# Patient Record
Sex: Female | Born: 1975 | Race: White | Hispanic: No | Marital: Married | Smoking: Never smoker
Health system: Southern US, Community
[De-identification: ages and names within clinical notes are randomized; demographics above are authoritative.]

## PROBLEM LIST (undated history)

## (undated) DIAGNOSIS — L409 Psoriasis, unspecified: Secondary | ICD-10-CM

## (undated) DIAGNOSIS — F419 Anxiety disorder, unspecified: Secondary | ICD-10-CM

## (undated) DIAGNOSIS — I10 Essential (primary) hypertension: Secondary | ICD-10-CM

## (undated) DIAGNOSIS — G2581 Restless legs syndrome: Secondary | ICD-10-CM

## (undated) HISTORY — PX: BREAST LUMPECTOMY: SHX2

## (undated) HISTORY — PX: VAGINAL HYSTERECTOMY: SUR661

---

## 2000-01-16 HISTORY — PX: BREAST LUMPECTOMY: SHX2

## 2001-01-15 DIAGNOSIS — T8859XA Other complications of anesthesia, initial encounter: Secondary | ICD-10-CM

## 2001-01-15 HISTORY — DX: Other complications of anesthesia, initial encounter: T88.59XA

## 2015-11-05 ENCOUNTER — Emergency Department
Admission: EM | Admit: 2015-11-05 | Discharge: 2015-11-05 | Disposition: A | Discharge status: Home / Self Care | Payer: BLUE CROSS/BLUE SHIELD | Attending: Emergency Medicine | Admitting: Emergency Medicine

## 2015-11-05 ENCOUNTER — Encounter: Payer: Self-pay | Admitting: Emergency Medicine

## 2015-11-05 ENCOUNTER — Emergency Department: Payer: BLUE CROSS/BLUE SHIELD

## 2015-11-05 DIAGNOSIS — I10 Essential (primary) hypertension: Secondary | ICD-10-CM | POA: Diagnosis not present

## 2015-11-05 DIAGNOSIS — L03032 Cellulitis of left toe: Secondary | ICD-10-CM | POA: Insufficient documentation

## 2015-11-05 DIAGNOSIS — M79675 Pain in left toe(s): Secondary | ICD-10-CM | POA: Diagnosis present

## 2015-11-05 HISTORY — DX: Psoriasis, unspecified: L40.9

## 2015-11-05 HISTORY — DX: Essential (primary) hypertension: I10

## 2015-11-05 LAB — BASIC METABOLIC PANEL
Anion gap: 7 (ref 5–15)
BUN: 16 mg/dL (ref 6–20)
CALCIUM: 8.9 mg/dL (ref 8.9–10.3)
CHLORIDE: 102 mmol/L (ref 101–111)
CO2: 26 mmol/L (ref 22–32)
CREATININE: 0.89 mg/dL (ref 0.44–1.00)
GFR calc Af Amer: >60 mL/min (ref >60)
GFR calc non Af Amer: >60 mL/min (ref >60)
GLUCOSE: 84 mg/dL (ref 65–99)
Potassium: 3.7 mmol/L (ref 3.5–5.1)
Sodium: 135 mmol/L (ref 135–145)

## 2015-11-05 LAB — CBC WITH DIFFERENTIAL/PLATELET
BASOS PCT: 1 %
Basophils Absolute: 0.1 10*3/uL (ref 0–0.1)
EOS ABS: 0.1 10*3/uL (ref 0–0.7)
Eosinophils Relative: 1 %
HEMATOCRIT: 35.1 % (ref 35.0–47.0)
HEMOGLOBIN: 12 g/dL (ref 12.0–16.0)
LYMPHS ABS: 1.4 10*3/uL (ref 1.0–3.6)
Lymphocytes Relative: 16 %
MCH: 32.3 pg (ref 26.0–34.0)
MCHC: 34.2 g/dL (ref 32.0–36.0)
MCV: 94.5 fL (ref 80.0–100.0)
MONO ABS: 0.4 10*3/uL (ref 0.2–0.9)
MONOS PCT: 4 %
NEUTROS ABS: 7 10*3/uL — AB (ref 1.4–6.5)
NEUTROS PCT: 78 %
Platelets: 264 10*3/uL (ref 150–440)
RBC: 3.72 MIL/uL — ABNORMAL LOW (ref 3.80–5.20)
RDW: 12.7 % (ref 11.5–14.5)
WBC: 9 10*3/uL (ref 3.6–11.0)

## 2015-11-05 MED ORDER — DIPHENHYDRAMINE HCL 50 MG/ML IJ SOLN
INTRAMUSCULAR | Status: AC
Start: 2015-11-05 — End: 2015-11-05
  Administered 2015-11-05: 25 mg via INTRAVENOUS
  Filled 2015-11-05: qty 1

## 2015-11-05 MED ORDER — DEXAMETHASONE SODIUM PHOSPHATE 10 MG/ML IJ SOLN
10.0000 mg | Freq: Once | INTRAMUSCULAR | Status: DC
  Filled 2015-11-05: qty 1

## 2015-11-05 MED ORDER — CIPROFLOXACIN IN D5W 400 MG/200ML IV SOLN
400.0000 mg | Freq: Once | INTRAVENOUS | Status: AC
  Administered 2015-11-05: 400 mg via INTRAVENOUS
  Filled 2015-11-05: qty 200

## 2015-11-05 MED ORDER — DIPHENHYDRAMINE HCL 50 MG/ML IJ SOLN
25.0000 mg | Freq: Once | INTRAMUSCULAR | Status: AC
  Administered 2015-11-05: 25 mg via INTRAVENOUS

## 2015-11-05 MED ORDER — CIPROFLOXACIN HCL 500 MG PO TABS: 500.0000 mg | ORAL_TABLET | Freq: Two times a day (BID) | ORAL | 0 refills | Status: DC

## 2015-11-05 MED ORDER — HYDROCODONE-ACETAMINOPHEN 5-325 MG PO TABS: 1.0000 | ORAL_TABLET | Freq: Four times a day (QID) | ORAL | 0 refills | Status: DC | PRN

## 2015-11-05 MED ORDER — CLINDAMYCIN HCL 300 MG PO CAPS: 300.0000 mg | ORAL_CAPSULE | Freq: Three times a day (TID) | ORAL | 0 refills | Status: AC

## 2015-11-05 MED ORDER — DEXAMETHASONE SODIUM PHOSPHATE 10 MG/ML IJ SOLN
10.0000 mg | Freq: Once | INTRAMUSCULAR | Status: AC
  Administered 2015-11-05: 10 mg via INTRAVENOUS

## 2015-11-05 NOTE — ED Provider Notes (Signed)
[facility] Emergency Department Provider Note ____________________________________________  Time seen: 1146  I have reviewed the triage vital signs and the nursing notes.  HISTORY  Chief Complaint  Toe Pain  HPI Suzanne Stout is a 40 y.o. female presents to the ED for evaluation of pain, swelling, and redness to the left foot. She noted, what she thought was a flare of her psoriasis about 3 days prior to the dorsal aspect of the 4th & 5th toes. She admits to scratching the toes, excessively overnight, 2 days prior. Since Thursday, she has noted increased pain, swelling, and difficulty with bearing weight through the left foot. She is also aware some tenderness and swelling to the dorsal aspect of the foot. She took The last pain and denies any other injury. She is aware of a small ulcerated wound between the fourth and fifth toes that she noted this morning.She denies any acute fevers, chills, or sweats. She also denies any overall feeling of malaise or infirmary.  Past Medical History:  Diagnosis Date  . Hypertension   . Psoriasis    There are no active problems to display for this patient.  Past Surgical History:  Procedure Laterality Date  . BREAST LUMPECTOMY      Prior to Admission medications   Medication Sig Start Date End Date Taking? Authorizing Provider  ciprofloxacin (CIPRO) 500 MG tablet Take 1 tablet (500 mg total) by mouth 2 (two) times daily. 11/05/15   Marchell Froman V Bacon Mansfield Dann, PA-C  clindamycin (CLEOCIN) 300 MG capsule Take 1 capsule (300 mg total) by mouth 3 (three) times daily. 11/05/15 11/15/15  Aalaiyah Yassin V Bacon Ronen Bromwell, PA-C   Allergies Latex  No family history on file.  Social History Social History  Substance Use Topics  . Smoking status: Never Smoker  . Smokeless tobacco: Not on file  . Alcohol use No   Review of Systems  Constitutional: Negative for fever. Cardiovascular: Negative for chest pain. Respiratory: Negative for  shortness of breath. Musculoskeletal: Negative for back pain. Left foot pain and swelling as above. Skin: Negative for rash. Neurological: Negative for headaches, focal weakness or numbness. ____________________________________________  PHYSICAL EXAM:  VITAL SIGNS: ED Triage Vitals  Enc Vitals Group     BP 11/05/15 1054 (!) 101/59     Pulse Rate 11/05/15 1054 88     Resp 11/05/15 1054 18     Temp 11/05/15 1054 98.1 F (36.7 C)     Temp Source 11/05/15 1054 Oral     SpO2 11/05/15 1054 100 %     Weight 11/05/15 1056 150 lb (68 kg)     Height 11/05/15 1056 5\' 4"  (1.626 m)     Head Circumference --      Peak Flow --      Pain Score 11/05/15 1056 3     Pain Loc --      Pain Edu? --      Excl. in [place]? --    Constitutional: Alert and oriented. Well appearing and in no distress. Head: Normocephalic and atraumatic. Cardiovascular: Normal rate, regular rhythm. Normal distal pulses. Respiratory: Normal respiratory effort. No wheezes/rales/rhonchi. Musculoskeletal: Nontender with normal range of motion in all extremities.  Neurologic:  Normal gait without ataxia. Normal speech and language. No gross focal neurologic deficits are appreciated. Skin:  Skin is warm, dry and intact. No rash noted. Left foot with obvious dorsal swelling and erythema arising from the fourth webspace. There is some relief streaking up the dorsal aspect of the foot.  The fourth webspace is notable for a open wound with some macerated tissue and mild purulent drainage. Psychiatric: Mood and affect are normal. Patient exhibits appropriate insight and judgment. ____________________________________________   LABS (pertinent positives/negatives) Labs Reviewed  CBC WITH DIFFERENTIAL/PLATELET - Abnormal; Notable for the following:       Result Value   RBC 3.72 (*)    Neutro Abs 7.0 (*)    All other components within normal limits  AEROBIC/ANAEROBIC CULTURE (SURGICAL/DEEP WOUND)  BASIC METABOLIC PANEL   ____________________________________________   RADIOLOGY  Left Foot  IMPRESSION: Negative.  Small plantar spur of calcaneus. I, Lisett Dirusso, Dannielle Karvonen, personally viewed and evaluated these images (plain radiographs) as part of my medical decision making, as well as reviewing the written report by the radiologist. ____________________________________________  PROCEDURES  Cipro 400 mg IVP Decadron 10 mg IVP ____________________________________________  INITIAL IMPRESSION / ASSESSMENT AND PLAN / ED COURSE  Patient with an acute foot cellulitis secondary to an open wound to the fourth webspace. She treated empirically with IV Cipro for Pseudomonas coverage. A wound culture is pending at the time of discharge. She'll be also discharged with PO Cipro as well as clindamycin for MSSA/MRSA coverage.   Clinical Course   ____________________________________________  FINAL CLINICAL IMPRESSION(S) / ED DIAGNOSES  Final diagnoses:  Cellulitis of toe of left foot      Melvenia Needles, PA-C 11/05/15 1459    Orbie Pyo, MD 11/05/15 [phone]

## 2015-11-05 NOTE — ED Triage Notes (Signed)
Reddened 4th toe L foot. States was rubbing it yesterday and then noted it reddened.

## 2015-11-05 NOTE — Discharge Instructions (Signed)
Take the prescription meds as directed. Rest with the foot elevated when seated. Avoid walking around barefoot. Keep the wound clean and dry. Follow-up with your primary provider for wound check or return to the ED for signs of worsening infection.

## 2015-11-05 NOTE — ED Notes (Addendum)
Pt states she felt like her psoriasis was flaring up in left foot on the 4th and 5th toes. Pt states she has been scratching at her toes and is having a lot of pain since Thursday.  Pt states it is difficult to walk on. Pt has hx of injury to toes in March but declined any XR.  Pt states she took Advil last night for pain, but nothing this morning.  Redness and tenderness around toes 3-5.

## 2015-11-12 LAB — AEROBIC/ANAEROBIC CULTURE (SURGICAL/DEEP WOUND): SPECIAL REQUESTS: NORMAL

## 2015-11-12 LAB — AEROBIC/ANAEROBIC CULTURE W GRAM STAIN (SURGICAL/DEEP WOUND)

## 2016-01-17 ENCOUNTER — Inpatient Hospital Stay: Payer: BLUE CROSS/BLUE SHIELD

## 2016-01-17 ENCOUNTER — Encounter: Payer: Self-pay | Admitting: Emergency Medicine

## 2016-01-17 ENCOUNTER — Emergency Department: Payer: BLUE CROSS/BLUE SHIELD

## 2016-01-17 ENCOUNTER — Inpatient Hospital Stay
Admission: EM | Admit: 2016-01-17 | Discharge: 2016-01-19 | DRG: 384 | Disposition: A | Discharge status: Home / Self Care | Payer: BLUE CROSS/BLUE SHIELD | Attending: Internal Medicine | Admitting: Internal Medicine

## 2016-01-17 DIAGNOSIS — R933 Abnormal findings on diagnostic imaging of other parts of digestive tract: Secondary | ICD-10-CM | POA: Diagnosis not present

## 2016-01-17 DIAGNOSIS — K269 Duodenal ulcer, unspecified as acute or chronic, without hemorrhage or perforation: Secondary | ICD-10-CM | POA: Diagnosis present

## 2016-01-17 DIAGNOSIS — Z79899 Other long term (current) drug therapy: Secondary | ICD-10-CM

## 2016-01-17 DIAGNOSIS — Z91040 Latex allergy status: Secondary | ICD-10-CM

## 2016-01-17 DIAGNOSIS — K2950 Unspecified chronic gastritis without bleeding: Secondary | ICD-10-CM | POA: Diagnosis present

## 2016-01-17 DIAGNOSIS — Z0189 Encounter for other specified special examinations: Secondary | ICD-10-CM

## 2016-01-17 DIAGNOSIS — G2581 Restless legs syndrome: Secondary | ICD-10-CM | POA: Diagnosis present

## 2016-01-17 DIAGNOSIS — R1084 Generalized abdominal pain: Secondary | ICD-10-CM | POA: Diagnosis not present

## 2016-01-17 DIAGNOSIS — I10 Essential (primary) hypertension: Secondary | ICD-10-CM | POA: Diagnosis present

## 2016-01-17 DIAGNOSIS — K3189 Other diseases of stomach and duodenum: Secondary | ICD-10-CM | POA: Diagnosis present

## 2016-01-17 DIAGNOSIS — R109 Unspecified abdominal pain: Secondary | ICD-10-CM | POA: Diagnosis present

## 2016-01-17 HISTORY — DX: Restless legs syndrome: G25.81

## 2016-01-17 LAB — URINALYSIS, COMPLETE (UACMP) WITH MICROSCOPIC
Bacteria, UA: NONE SEEN
Bilirubin Urine: NEGATIVE
GLUCOSE, UA: NEGATIVE mg/dL
HGB URINE DIPSTICK: NEGATIVE
Ketones, ur: NEGATIVE mg/dL
Leukocytes, UA: NEGATIVE
Nitrite: NEGATIVE
Protein, ur: NEGATIVE mg/dL
RBC / HPF: NONE SEEN RBC/hpf (ref 0–5)
SPECIFIC GRAVITY, URINE: 1.014 (ref 1.005–1.030)
WBC UA: NONE SEEN WBC/hpf (ref 0–5)
pH: 7 (ref 5.0–8.0)

## 2016-01-17 LAB — CBC
HCT: 40.7 % (ref 35.0–47.0)
Hemoglobin: 14 g/dL (ref 12.0–16.0)
MCH: 31.8 pg (ref 26.0–34.0)
MCHC: 34.3 g/dL (ref 32.0–36.0)
MCV: 92.8 fL (ref 80.0–100.0)
PLATELETS: 451 10*3/uL — AB (ref 150–440)
RBC: 4.39 MIL/uL (ref 3.80–5.20)
RDW: 12.6 % (ref 11.5–14.5)
WBC: 19.3 10*3/uL — ABNORMAL HIGH (ref 3.6–11.0)

## 2016-01-17 LAB — COMPREHENSIVE METABOLIC PANEL
ALBUMIN: 4.2 g/dL (ref 3.5–5.0)
ALK PHOS: 72 U/L (ref 38–126)
ALT: 20 U/L (ref 14–54)
AST: 20 U/L (ref 15–41)
Anion gap: 10 (ref 5–15)
BUN: 15 mg/dL (ref 6–20)
CALCIUM: 9.9 mg/dL (ref 8.9–10.3)
CO2: 29 mmol/L (ref 22–32)
CREATININE: 0.95 mg/dL (ref 0.44–1.00)
Chloride: 96 mmol/L — ABNORMAL LOW (ref 101–111)
GFR calc Af Amer: >60 mL/min (ref >60)
GFR calc non Af Amer: >60 mL/min (ref >60)
GLUCOSE: 107 mg/dL — AB (ref 65–99)
Potassium: 3.8 mmol/L (ref 3.5–5.1)
SODIUM: 135 mmol/L (ref 135–145)
Total Bilirubin: 0.6 mg/dL (ref 0.3–1.2)
Total Protein: 8.3 g/dL — ABNORMAL HIGH (ref 6.5–8.1)

## 2016-01-17 LAB — LIPASE, BLOOD: Lipase: 25 U/L (ref 11–51)

## 2016-01-17 LAB — TROPONIN I

## 2016-01-17 MED ORDER — SODIUM CHLORIDE 0.9 % IV BOLUS (SEPSIS)
1000.0000 mL | Freq: Once | INTRAVENOUS | Status: AC
  Administered 2016-01-17: 1000 mL via INTRAVENOUS

## 2016-01-17 MED ORDER — ONDANSETRON HCL 4 MG PO TABS
4.0000 mg | ORAL_TABLET | Freq: Four times a day (QID) | ORAL | Status: DC | PRN
Start: 2016-01-17 — End: 2016-01-19

## 2016-01-17 MED ORDER — ONDANSETRON HCL 4 MG/2ML IJ SOLN
4.0000 mg | Freq: Four times a day (QID) | INTRAMUSCULAR | Status: DC | PRN
  Administered 2016-01-17: 4 mg via INTRAVENOUS
  Filled 2016-01-17: qty 2

## 2016-01-17 MED ORDER — FAMOTIDINE IN NACL 20-0.9 MG/50ML-% IV SOLN
20.0000 mg | Freq: Once | INTRAVENOUS | Status: AC
  Administered 2016-01-17: 20 mg via INTRAVENOUS
  Filled 2016-01-17: qty 50

## 2016-01-17 MED ORDER — IOPAMIDOL (ISOVUE-300) INJECTION 61%
30.0000 mL | Freq: Once | INTRAVENOUS | Status: AC
  Administered 2016-01-17: 30 mL via ORAL

## 2016-01-17 MED ORDER — MORPHINE SULFATE (PF) 4 MG/ML IV SOLN
4.0000 mg | Freq: Once | INTRAVENOUS | Status: AC
  Administered 2016-01-17: 4 mg via INTRAVENOUS
  Filled 2016-01-17: qty 1

## 2016-01-17 MED ORDER — ONDANSETRON HCL 4 MG/2ML IJ SOLN
4.0000 mg | Freq: Once | INTRAMUSCULAR | Status: AC
  Administered 2016-01-17: 4 mg via INTRAVENOUS
  Filled 2016-01-17: qty 2

## 2016-01-17 MED ORDER — IOPAMIDOL (ISOVUE-300) INJECTION 61%
100.0000 mL | Freq: Once | INTRAVENOUS | Status: AC | PRN
  Administered 2016-01-17: 100 mL via INTRAVENOUS

## 2016-01-17 NOTE — ED Triage Notes (Signed)
Pt presents to ED with c/o epigastric pain with chest pressure x3-4 days, associated with nausea and vomiting. Denies diarrhea.

## 2016-01-17 NOTE — ED Notes (Signed)
Pt. Finished 1 contrast at this time.

## 2016-01-17 NOTE — ED Notes (Signed)
Pt. States upper epigastric pain since 12/29 associated with N/V/D. Pt states not tolerating anything PO. Pt. Reports diarrhea subsided x2 days

## 2016-01-17 NOTE — ED Notes (Signed)
Pt. States familial hx gallbladder complications.

## 2016-01-17 NOTE — ED Provider Notes (Signed)
[facility] Emergency Department Provider Note   ____________________________________________   I have reviewed the triage vital signs and the nursing notes.   HISTORY  Chief Complaint Abdominal Pain and Chest Pain   History limited by: Not Limited   HPI Suzanne Stout is a 41 y.o. female who presents to the emergency department today who presents for abdominal pain. It is located primarily in the epigastric and left upper quadrant. It started four days ago. Will come and go in waves. It will become severe. Has been associated with nausea and vomiting. Has never had symptoms like this in the past. No fevers. No recent travel.    Past Medical History:  Diagnosis Date  . Hypertension   . Psoriasis     There are no active problems to display for this patient.   Past Surgical History:  Procedure Laterality Date  . BREAST LUMPECTOMY      Prior to Admission medications   Medication Sig Start Date End Date Taking? Authorizing Provider  ciprofloxacin (CIPRO) 500 MG tablet Take 1 tablet (500 mg total) by mouth 2 (two) times daily. 11/05/15   Jenise V Bacon Menshew, PA-C  HYDROcodone-acetaminophen (NORCO) 5-325 MG tablet Take 1 tablet by mouth every 6 (six) hours as needed. 11/05/15   Jenise V Bacon Menshew, PA-C    Allergies Latex  History reviewed. No pertinent family history.  Social History Social History  Substance Use Topics  . Smoking status: Never Smoker  . Smokeless tobacco: Never Used  . Alcohol use No    Review of Systems  Constitutional: Negative for fever. Cardiovascular: Negative for chest pain. Respiratory: Negative for shortness of breath. Gastrointestinal: Positive for abdominal pain, nausea and vomiting. Genitourinary: Negative for dysuria. Musculoskeletal: Negative for back pain. Skin: Negative for rash. Neurological: Negative for headaches, focal weakness or numbness.  10-point ROS otherwise  negative.  ____________________________________________   PHYSICAL EXAM:  VITAL SIGNS: ED Triage Vitals  Enc Vitals Group     BP 01/17/16 1919 102/61     Pulse Rate 01/17/16 1919 (!) 108     Resp 01/17/16 1919 18     Temp 01/17/16 1919 98.4 F (36.9 C)     Temp Source 01/17/16 1919 Oral     SpO2 01/17/16 1919 99 %     Weight 01/17/16 1920 155 lb (70.3 kg)     Height 01/17/16 1920 5\' 4"  (1.626 m)     Head Circumference --      Peak Flow --      Pain Score 01/17/16 1923 7   Constitutional: Alert and oriented. Appears uncomfortable. Eyes: Conjunctivae are normal. Normal extraocular movements. ENT   Head: Normocephalic and atraumatic.   Nose: No congestion/rhinnorhea.   Mouth/Throat: Mucous membranes are moist.   Neck: No stridor. Hematological/Lymphatic/Immunilogical: No cervical lymphadenopathy. Cardiovascular: Normal rate, regular rhythm.  No murmurs, rubs, or gallops.  Respiratory: Normal respiratory effort without tachypnea nor retractions. Breath sounds are clear and equal bilaterally. No wheezes/rales/rhonchi. Gastrointestinal: Soft. Tender to palpation somewhat diffusely however worse in the epigastric and left upper quadrants.  Genitourinary: Deferred Musculoskeletal: Normal range of motion in all extremities. No lower extremity edema. Neurologic:  Normal speech and language. No gross focal neurologic deficits are appreciated.  Skin:  Skin is warm, dry and intact. No rash noted. Psychiatric: Mood and affect are normal. Speech and behavior are normal. Patient exhibits appropriate insight and judgment.  ____________________________________________    LABS (pertinent positives/negatives)  Labs Reviewed  COMPREHENSIVE METABOLIC PANEL - Abnormal;  Notable for the following:       Result Value   Chloride 96 (*)    Glucose, Bld 107 (*)    Total Protein 8.3 (*)    All other components within normal limits  CBC - Abnormal; Notable for the following:    WBC  19.3 (*)    Platelets 451 (*)    All other components within normal limits  LIPASE, BLOOD  TROPONIN I  URINALYSIS, COMPLETE (UACMP) WITH MICROSCOPIC     ____________________________________________   EKG  I, Nance Pear, attending physician, personally viewed and interpreted this EKG  EKG Time: 1915 Rate: 112 Rhythm: sinus tachycardia Axis: normal Intervals: qtc 436 QRS: narrow ST changes: no st elevation Impression: sinus tachycardia   ____________________________________________    RADIOLOGY  CT abd/pel IMPRESSION: 1. Moderate gastric distention. 2. Small left kidney possibly related atrophy or normal variant. 3. Uterine myoma. 4. Otherwise unremarkable CT of the abdomen and pelvis.  ____________________________________________   PROCEDURES  Procedures  ____________________________________________   INITIAL IMPRESSION / ASSESSMENT AND PLAN / ED COURSE  Pertinent labs & imaging results that were available during my care of the patient were reviewed by me and considered in my medical decision making (see chart for details).  Patient presented to the emergency department today because of concerns for epigastric and left upper quadrant abdominal pain. Exam patient did have tenderness in the epigastrium left upper quadrant. Patient had a significant leukocytosis on blood work. Because of this CT scan was performed. This did show moderate gastric distention. Patient does not have any diabetes. His possible this secondary to gastritis or peptic ulcer disease. Will plan on placing NG tube. Will give patient IV Pepcid. Will admit patient to hospitalist service.   ____________________________________________   FINAL CLINICAL IMPRESSION(S) / ED DIAGNOSES  Final diagnoses:  Abdominal pain, unspecified abdominal location  Gastric distention     Note: This dictation was prepared with Dragon dictation. Any transcriptional errors that result from this process  are unintentional     Nance Pear, MD 01/17/16 2249

## 2016-01-17 NOTE — ED Notes (Signed)
Pt. States unable to urinate for pregnancy test, willing to sign waiver for CT. CT notified.

## 2016-01-17 NOTE — H&P (Signed)
[facility] [place] NAME: Suzanne Stout    MR#:  [id number]  DATE OF BIRTH:  1975/03/01  DATE OF ADMISSION:  01/17/2016  PRIMARY CARE PHYSICIAN: Pcp Not In System   REQUESTING/REFERRING PHYSICIAN: Archie Balboa, MD  CHIEF COMPLAINT:   Chief Complaint  Patient presents with  . Abdominal Pain  . Chest Pain    HISTORY OF PRESENT ILLNESS:  Suzanne Stout  is a 41 y.o. female who presents with Abdominal pain and distention for the past 4 days. Patient states that over this 4 days the pain has gotten worse, initially was sharp in nature epigastric and left upper quadrant, but now includes significant distention pain as well. This is associated with significant nausea and vomiting. Here in the ED she was found to have an elevated white count, and was tachycardic, without any significant source of infection identified. Her CT scan was within normal limits except for significant gastric distention. Surgery reviewed her scan and felt that she did not have surgical need at this time, but that this gastric distention is sometimes seen in peptic ulcer disease. Hospitalists were called for admission  PAST MEDICAL HISTORY:   Past Medical History:  Diagnosis Date  . Hypertension   . Psoriasis   . RLS (restless legs syndrome)     PAST SURGICAL HISTORY:   Past Surgical History:  Procedure Laterality Date  . BREAST LUMPECTOMY      SOCIAL HISTORY:   Social History  Substance Use Topics  . Smoking status: Never Smoker  . Smokeless tobacco: Never Used  . Alcohol use No    FAMILY HISTORY:   Family History  Problem Relation Age of Onset  . Gallbladder disease Other     DRUG ALLERGIES:   Allergies  Allergen Reactions  . Latex Rash    MEDICATIONS AT HOME:   Prior to Admission medications   Medication Sig Start Date End Date Taking? Authorizing Provider  amoxicillin-clavulanate (AUGMENTIN) 875-125 MG tablet Take 1 tablet by mouth  2 (two) times daily. 01/11/16 01/21/16 Yes Historical Provider, MD  lisinopril-hydrochlorothiazide (PRINZIDE,ZESTORETIC) 20-25 MG tablet Take 1 tablet by mouth daily.   Yes Historical Provider, MD  methylPREDNISolone (MEDROL DOSEPAK) 4 MG TBPK tablet Take by mouth.   Yes Historical Provider, MD  rOPINIRole (REQUIP) 0.25 MG tablet Take 0.25 mg by mouth at bedtime.   Yes Historical Provider, MD  zolpidem (AMBIEN) 10 MG tablet Take 10 mg by mouth at bedtime.   Yes Historical Provider, MD    REVIEW OF SYSTEMS:  Review of Systems  Constitutional: Negative for chills, fever, malaise/fatigue and weight loss.  HENT: Negative for ear pain, hearing loss and tinnitus.   Eyes: Negative for blurred vision, double vision, pain and redness.  Respiratory: Negative for cough, hemoptysis and shortness of breath.   Cardiovascular: Negative for chest pain, palpitations, orthopnea and leg swelling.  Gastrointestinal: Positive for abdominal pain, nausea and vomiting. Negative for constipation and diarrhea.  Genitourinary: Negative for dysuria, frequency and hematuria.  Musculoskeletal: Negative for back pain, joint pain and neck pain.  Skin:       No acne, rash, or lesions  Neurological: Negative for dizziness, tremors, focal weakness and weakness.  Endo/Heme/Allergies: Negative for polydipsia. Does not bruise/bleed easily.  Psychiatric/Behavioral: Negative for depression. The patient is not nervous/anxious and does not have insomnia.      VITAL SIGNS:   Vitals:   01/17/16 1919 01/17/16 1920  BP: 102/61   Pulse: (!) 108  Resp: 18   Temp: 98.4 F (36.9 C)   TempSrc: Oral   SpO2: 99%   Weight:  70.3 kg (155 lb)  Height:  5\' 4"  (1.626 m)   Wt Readings from Last 3 Encounters:  01/17/16 70.3 kg (155 lb)  11/05/15 68 kg (150 lb)    PHYSICAL EXAMINATION:  Physical Exam  Vitals reviewed. Constitutional: She is oriented to person, place, and time. She appears well-developed and well-nourished. No  distress.  HENT:  Head: Normocephalic and atraumatic.  Mouth/Throat: Oropharynx is clear and moist.  Eyes: Conjunctivae and EOM are normal. Pupils are equal, round, and reactive to light. No scleral icterus.  Neck: Normal range of motion. Neck supple. No JVD present. No thyromegaly present.  Cardiovascular: Normal rate, regular rhythm and intact distal pulses.  Exam reveals no gallop and no friction rub.   No murmur heard. Respiratory: Effort normal and breath sounds normal. No respiratory distress. She has no wheezes. She has no rales.  GI: Soft. Bowel sounds are normal. She exhibits distension. There is tenderness.  Musculoskeletal: Normal range of motion. She exhibits no edema.  No arthritis, no gout  Lymphadenopathy:    She has no cervical adenopathy.  Neurological: She is alert and oriented to person, place, and time. No cranial nerve deficit.  No dysarthria, no aphasia  Skin: Skin is warm and dry. No rash noted. No erythema.  Psychiatric: She has a normal mood and affect. Her behavior is normal. Judgment and thought content normal.    LABORATORY PANEL:   CBC  Recent Labs Lab 01/17/16 1920  WBC 19.3*  HGB 14.0  HCT 40.7  PLT 451*   ------------------------------------------------------------------------------------------------------------------  Chemistries   Recent Labs Lab 01/17/16 1920  NA 135  K 3.8  CL 96*  CO2 29  GLUCOSE 107*  BUN 15  CREATININE 0.95  CALCIUM 9.9  AST 20  ALT 20  ALKPHOS 72  BILITOT 0.6   ------------------------------------------------------------------------------------------------------------------  Cardiac Enzymes  Recent Labs Lab 01/17/16 1920  TROPONINI <0.03   ------------------------------------------------------------------------------------------------------------------  RADIOLOGY:  Dg Chest 2 View  Result Date: 01/17/2016 CLINICAL DATA:  Epigastric pain with chest pressure EXAM: CHEST  2 VIEW COMPARISON:  None.  FINDINGS: No acute pulmonary infiltrate, consolidation, or pleural effusion. Normal heart size. No pneumothorax. Moderate scoliosis of the spine. IMPRESSION: No active cardiopulmonary disease. Electronically Signed   By: Donavan Foil M.D.   On: 01/17/2016 20:33   Ct Abdomen Pelvis W Contrast  Result Date: 01/17/2016 CLINICAL DATA:  41 y/o F; epigastric pain with chest pressure for 3-4 days associated with nausea and vomiting. EXAM: CT ABDOMEN AND PELVIS WITH CONTRAST TECHNIQUE: Multidetector CT imaging of the abdomen and pelvis was performed using the standard protocol following bolus administration of intravenous contrast. CONTRAST:  154mL ISOVUE-300 IOPAMIDOL (ISOVUE-300) INJECTION 61% COMPARISON:  None. FINDINGS: Lower chest: No acute abnormality. Hepatobiliary: No focal liver abnormality is seen. No gallstones, gallbladder wall thickening, or biliary dilatation. Pancreas: Unremarkable. No pancreatic ductal dilatation or surrounding inflammatory changes. Spleen: Normal in size without focal abnormality. Adrenals/Urinary Tract: Adrenal glands are unremarkable. Small left kidney measuring up to 70 mm compared with the right which measures 106 mm, possibly related atrophy or normal variant. Kidneys are otherwise normal, without renal calculi, focal lesion, or hydronephrosis. Bladder is unremarkable. Stomach/Bowel: Stomach is moderately distended. Appendix appears normal. No evidence of bowel wall thickening, distention, or inflammatory changes. Vascular/Lymphatic: No significant vascular findings are present. No enlarged abdominal or pelvic lymph nodes. Reproductive: Left-sided adnexal cysts  measuring up to 27 mm with benign features. Low-lying intrauterine device. Uterine fundal hyperdense focus measuring up to 30 mm is likely to represent a myoma. Other: No abdominal wall hernia or abnormality. No abdominopelvic ascites. Musculoskeletal: Mild levocurvature of the spine with apex at L1. IMPRESSION: 1. Moderate  gastric distention. 2. Small left kidney possibly related atrophy or normal variant. 3. Uterine myoma. 4. Otherwise unremarkable CT of the abdomen and pelvis. Electronically Signed   By: Kristine Garbe M.D.   On: 01/17/2016 22:28    EKG:   Orders placed or performed during the hospital encounter of 01/17/16  . EKG 12-Lead  . EKG 12-Lead  . ED EKG within 10 minutes  . ED EKG within 10 minutes    IMPRESSION AND PLAN:  Principal Problem:   Abdominal pain - unclear etiology at this time, patient does have elevated white blood cell count though no other significant indication or symptoms of infection. We'll treat her distention with NG tube as below. We'll get a GI consult. We will order PPI. Active Problems:   Gastric distention - unclear etiology for gastric distention, though possibility of PUD is in the differential. Treatment as above with NG tube in PPI, GI consult   HTN (hypertension) - patient has borderline low blood pressure, hold antihypertensives for now, IV fluids for hydration   RLS (restless legs syndrome) - continue home meds  All the records are reviewed and case discussed with ED provider. Management plans discussed with the patient and/or family.  DVT PROPHYLAXIS: SubQ lovenox  GI PROPHYLAXIS: PPI  ADMISSION STATUS: Inpatient  CODE STATUS: Full Code Status History    This patient does not have a recorded code status. Please follow your organizational policy for patients in this situation.      TOTAL TIME TAKING CARE OF THIS PATIENT: 45 minutes.    Jakia Kennebrew FIELDING 01/17/2016, 11:06 PM  [facility]  Office  [phone]  CC: Primary care physician; Pcp Not In System

## 2016-01-18 ENCOUNTER — Inpatient Hospital Stay: Payer: BLUE CROSS/BLUE SHIELD

## 2016-01-18 DIAGNOSIS — K3189 Other diseases of stomach and duodenum: Secondary | ICD-10-CM

## 2016-01-18 DIAGNOSIS — R933 Abnormal findings on diagnostic imaging of other parts of digestive tract: Secondary | ICD-10-CM

## 2016-01-18 LAB — CBC
HCT: 34.1 % — ABNORMAL LOW (ref 35.0–47.0)
HEMOGLOBIN: 11.9 g/dL — AB (ref 12.0–16.0)
MCH: 32.3 pg (ref 26.0–34.0)
MCHC: 35 g/dL (ref 32.0–36.0)
MCV: 92.2 fL (ref 80.0–100.0)
Platelets: 335 10*3/uL (ref 150–440)
RBC: 3.7 MIL/uL — ABNORMAL LOW (ref 3.80–5.20)
RDW: 12.4 % (ref 11.5–14.5)
WBC: 11.7 10*3/uL — ABNORMAL HIGH (ref 3.6–11.0)

## 2016-01-18 LAB — BASIC METABOLIC PANEL
ANION GAP: 4 — AB (ref 5–15)
BUN: 11 mg/dL (ref 6–20)
CALCIUM: 8.5 mg/dL — AB (ref 8.9–10.3)
CO2: 29 mmol/L (ref 22–32)
Chloride: 104 mmol/L (ref 101–111)
Creatinine, Ser: 0.95 mg/dL (ref 0.44–1.00)
GLUCOSE: 88 mg/dL (ref 65–99)
Potassium: 3.2 mmol/L — ABNORMAL LOW (ref 3.5–5.1)
SODIUM: 137 mmol/L (ref 135–145)

## 2016-01-18 LAB — PREGNANCY, URINE: PREG TEST UR: NEGATIVE

## 2016-01-18 MED ORDER — OXYCODONE HCL 5 MG PO TABS: 5.0000 mg | ORAL_TABLET | ORAL | Status: DC | PRN

## 2016-01-18 MED ORDER — MORPHINE SULFATE (PF) 2 MG/ML IV SOLN: 2.0000 mg | INTRAVENOUS | Status: DC | PRN

## 2016-01-18 MED ORDER — SODIUM CHLORIDE 0.9 % IV SOLN
INTRAVENOUS | Status: DC
  Administered 2016-01-18: 02:00:00 via INTRAVENOUS

## 2016-01-18 MED ORDER — POTASSIUM CHLORIDE 20 MEQ PO PACK
20.0000 meq | PACK | Freq: Once | ORAL | Status: AC
  Administered 2016-01-18: 20 meq via ORAL
  Filled 2016-01-18: qty 1

## 2016-01-18 MED ORDER — INFLUENZA VAC SPLIT QUAD 0.5 ML IM SUSY: 0.5000 mL | PREFILLED_SYRINGE | INTRAMUSCULAR | Status: DC

## 2016-01-18 MED ORDER — ACETAMINOPHEN 650 MG RE SUPP: 650.0000 mg | Freq: Four times a day (QID) | RECTAL | Status: DC | PRN

## 2016-01-18 MED ORDER — PANTOPRAZOLE SODIUM 40 MG PO TBEC
40.0000 mg | DELAYED_RELEASE_TABLET | Freq: Every day | ORAL | Status: DC
  Administered 2016-01-18 – 2016-01-19 (×2): 40 mg via ORAL
  Filled 2016-01-18 (×2): qty 1

## 2016-01-18 MED ORDER — ENOXAPARIN SODIUM 40 MG/0.4ML ~~LOC~~ SOLN
40.0000 mg | Freq: Every day | SUBCUTANEOUS | Status: DC
  Filled 2016-01-18: qty 0.4

## 2016-01-18 MED ORDER — ZOLPIDEM TARTRATE 5 MG PO TABS
10.0000 mg | ORAL_TABLET | Freq: Every day | ORAL | Status: DC
  Administered 2016-01-18 (×2): 10 mg via ORAL
  Filled 2016-01-18: qty 2
  Filled 2016-01-18: qty 1
  Filled 2016-01-18: qty 2

## 2016-01-18 MED ORDER — ACETAMINOPHEN 325 MG PO TABS: 650.0000 mg | ORAL_TABLET | Freq: Four times a day (QID) | ORAL | Status: DC | PRN

## 2016-01-18 MED ORDER — PROCHLORPERAZINE EDISYLATE 5 MG/ML IJ SOLN
10.0000 mg | Freq: Four times a day (QID) | INTRAMUSCULAR | Status: DC | PRN
  Administered 2016-01-18: 10 mg via INTRAVENOUS
  Filled 2016-01-18: qty 2

## 2016-01-18 MED ORDER — ROPINIROLE HCL 0.25 MG PO TABS
0.2500 mg | ORAL_TABLET | Freq: Every day | ORAL | Status: DC
  Administered 2016-01-18 (×2): 0.25 mg via ORAL
  Filled 2016-01-18 (×2): qty 1

## 2016-01-18 NOTE — Progress Notes (Signed)
[place] at [facility]                                                                                                                                                                                  Patient Demographics   Suzanne Stout, is a 40 y.o. female, DOB - [birth year], YL:[id number]  Admit date - 01/17/2016   Admitting Physician Lance Coon, MD  Outpatient Primary MD for the patient is [facility] Not In System   LOS - 1  Subjective: Patient admitted with abdominal pain noted to have gastric distention has NG tube in place no significant output was seen by GI plan for EGD tomorrow abdominal pain resolved    Review of Systems:   CONSTITUTIONAL: No documented fever. No fatigue, weakness. No weight gain, no weight loss.  EYES: No blurry or double vision.  ENT: No tinnitus. No postnasal drip. No redness of the oropharynx.  RESPIRATORY: No cough, no wheeze, no hemoptysis. No dyspnea.  CARDIOVASCULAR: No chest pain. No orthopnea. No palpitations. No syncope.  GASTROINTESTINAL: No nausea, no vomiting or diarrhea. No abdominal pain. No melena or hematochezia.  GENITOURINARY: No dysuria or hematuria.  ENDOCRINE: No polyuria or nocturia. No heat or cold intolerance.  HEMATOLOGY: No anemia. No bruising. No bleeding.  INTEGUMENTARY: No rashes. No lesions.  MUSCULOSKELETAL: No arthritis. No swelling. No gout.  NEUROLOGIC: No numbness, tingling, or ataxia. No seizure-type activity.  PSYCHIATRIC: No anxiety. No insomnia. No ADD.    Vitals:   Vitals:   01/17/16 1920 01/17/16 2352 01/18/16 0053 01/18/16 0358  BP:  109/72 (!) 101/54 (!) 96/51  Pulse:  86 77 75  Resp:  17 20 (!) 22  Temp:   98 F (36.7 C) 97.7 F (36.5 C)  TempSrc:   Oral Oral  SpO2:  98% 98% 98%  Weight: 155 lb (70.3 kg)  158 lb 6.4 oz (71.8 kg)   Height: 5\' 4"  (1.626 m)  5\' 4"  (1.626 m)     Wt Readings from Last 3 Encounters:  01/18/16 158 lb 6.4 oz (71.8 kg)  11/05/15 150 lb (68 kg)      Intake/Output Summary (Last 24 hours) at 01/18/16 1310 Last data filed at 01/18/16 1228  Gross per 24 hour  Intake              259 ml  Output               75 ml  Net              184 ml    Physical Exam:   GENERAL: Pleasant-appearing in no apparent distress.  HEAD, EYES, EARS, NOSE AND THROAT:  Atraumatic, normocephalic. Extraocular muscles are intact. Pupils equal and reactive to light. Sclerae anicteric. No conjunctival injection. No oro-pharyngeal erythema.  NECK: Supple. There is no jugular venous distention. No bruits, no lymphadenopathy, no thyromegaly.  HEART: Regular rate and rhythm,. No murmurs, no rubs, no clicks.  LUNGS: Clear to auscultation bilaterally. No rales or rhonchi. No wheezes.  ABDOMEN: Soft, flat, nontender, nondistended. Has good bowel sounds. No hepatosplenomegaly appreciated.  EXTREMITIES: No evidence of any cyanosis, clubbing, or peripheral edema.  +2 pedal and radial pulses bilaterally.  NEUROLOGIC: The patient is alert, awake, and oriented x3 with no focal motor or sensory deficits appreciated bilaterally.  SKIN: Moist and warm with no rashes appreciated.  Psych: Not anxious, depressed LN: No inguinal LN enlargement    Antibiotics   Anti-infectives    None      Medications   Scheduled Meds: . enoxaparin (LOVENOX) injection  40 mg Subcutaneous QHS  . [START ON 01/19/2016] Influenza vac split quadrivalent PF  0.5 mL Intramuscular Tomorrow-1000  . pantoprazole  40 mg Oral QAC breakfast  . rOPINIRole  0.25 mg Oral QHS  . zolpidem  10 mg Oral QHS   Continuous Infusions: PRN Meds:.acetaminophen **OR** acetaminophen, morphine injection, ondansetron **OR** ondansetron (ZOFRAN) IV, oxyCODONE, prochlorperazine   Data Review:   Micro Results No results found for this or any previous visit (from the past 240 hour(s)).  Radiology Reports Dg Chest 2 View  Result Date: 01/17/2016 CLINICAL DATA:  Epigastric pain with chest pressure EXAM: CHEST  2  VIEW COMPARISON:  None. FINDINGS: No acute pulmonary infiltrate, consolidation, or pleural effusion. Normal heart size. No pneumothorax. Moderate scoliosis of the spine. IMPRESSION: No active cardiopulmonary disease. Electronically Signed   By: Donavan Foil M.D.   On: 01/17/2016 20:33   Ct Abdomen Pelvis W Contrast  Result Date: 01/17/2016 CLINICAL DATA:  41 y/o F; epigastric pain with chest pressure for 3-4 days associated with nausea and vomiting. EXAM: CT ABDOMEN AND PELVIS WITH CONTRAST TECHNIQUE: Multidetector CT imaging of the abdomen and pelvis was performed using the standard protocol following bolus administration of intravenous contrast. CONTRAST:  169mL ISOVUE-300 IOPAMIDOL (ISOVUE-300) INJECTION 61% COMPARISON:  None. FINDINGS: Lower chest: No acute abnormality. Hepatobiliary: No focal liver abnormality is seen. No gallstones, gallbladder wall thickening, or biliary dilatation. Pancreas: Unremarkable. No pancreatic ductal dilatation or surrounding inflammatory changes. Spleen: Normal in size without focal abnormality. Adrenals/Urinary Tract: Adrenal glands are unremarkable. Small left kidney measuring up to 70 mm compared with the right which measures 106 mm, possibly related atrophy or normal variant. Kidneys are otherwise normal, without renal calculi, focal lesion, or hydronephrosis. Bladder is unremarkable. Stomach/Bowel: Stomach is moderately distended. Appendix appears normal. No evidence of bowel wall thickening, distention, or inflammatory changes. Vascular/Lymphatic: No significant vascular findings are present. No enlarged abdominal or pelvic lymph nodes. Reproductive: Left-sided adnexal cysts measuring up to 27 mm with benign features. Low-lying intrauterine device. Uterine fundal hyperdense focus measuring up to 30 mm is likely to represent a myoma. Other: No abdominal wall hernia or abnormality. No abdominopelvic ascites. Musculoskeletal: Mild levocurvature of the spine with apex at L1.  IMPRESSION: 1. Moderate gastric distention. 2. Small left kidney possibly related atrophy or normal variant. 3. Uterine myoma. 4. Otherwise unremarkable CT of the abdomen and pelvis. Electronically Signed   By: Kristine Garbe M.D.   On: 01/17/2016 22:28   Dg Abd Portable 1v  Result Date: 01/18/2016 CLINICAL DATA:  Nasogastric tube reinsertion. EXAM: PORTABLE ABDOMEN - 1 VIEW COMPARISON:  01/17/2016 FINDINGS: The nasogastric tube extends into the stomach and curls up into the fundus. IMPRESSION: Nasogastric tube extends well into the stomach. Electronically Signed   By: Andreas Newport M.D.   On: 01/18/2016 01:57   Dg Abd Portable 1 View  Result Date: 01/17/2016 CLINICAL DATA:  Initial evaluation for NG tube placement. EXAM: PORTABLE ABDOMEN - 1 VIEW COMPARISON:  Prior CT from 1 day earlier the same day. FINDINGS: An enteric tube is in place, with tip overlying the stomach, at or just beyond the GE junction. Side hole is in the distal esophagus. Consider advancement by 7-8 cm to insure adequate placement within the stomach. Bowel gas pattern within normal limits. Secrete IV contrast material present within the renal collecting systems bilaterally. Scoliosis noted.  Visualized lung bases are clear. IMPRESSION: Enteric tube in place with tip at the level of the GE junction, and side hole in the distal esophagus. Consider advancement by 7-8 cm to insure adequate placement within the stomach. Electronically Signed   By: Jeannine Boga M.D.   On: 01/17/2016 23:52     CBC  Recent Labs Lab 01/17/16 1920 01/18/16 0626  WBC 19.3* 11.7*  HGB 14.0 11.9*  HCT 40.7 34.1*  PLT 451* 335  MCV 92.8 92.2  MCH 31.8 32.3  MCHC 34.3 35.0  RDW 12.6 12.4    Chemistries   Recent Labs Lab 01/17/16 1920 01/18/16 0626  NA 135 137  K 3.8 3.2*  CL 96* 104  CO2 29 29  GLUCOSE 107* 88  BUN 15 11  CREATININE 0.95 0.95  CALCIUM 9.9 8.5*  AST 20  --   ALT 20  --   ALKPHOS 72  --   BILITOT  0.6  --    ------------------------------------------------------------------------------------------------------------------ estimated creatinine clearance is 77.3 mL/min (by C-G formula based on SCr of 0.95 mg/dL). ------------------------------------------------------------------------------------------------------------------ No results for input(s): HGBA1C in the last 72 hours. ------------------------------------------------------------------------------------------------------------------ No results for input(s): CHOL, HDL, LDLCALC, TRIG, CHOLHDL, LDLDIRECT in the last 72 hours. ------------------------------------------------------------------------------------------------------------------ No results for input(s): TSH, T4TOTAL, T3FREE, THYROIDAB in the last 72 hours.  Invalid input(s): FREET3 ------------------------------------------------------------------------------------------------------------------ No results for input(s): VITAMINB12, FOLATE, FERRITIN, TIBC, IRON, RETICCTPCT in the last 72 hours.  Coagulation profile No results for input(s): INR, PROTIME in the last 168 hours.  No results for input(s): DDIMER in the last 72 hours.  Cardiac Enzymes  Recent Labs Lab 01/17/16 1920  TROPONINI <0.03   ------------------------------------------------------------------------------------------------------------------ Invalid input(s): POCBNP    Assessment & Plan  Patient is a 41 year old presenting with abdominal pain 1. Abdominal pain - due to  Gastric distention - patient was decompressed with NG tube. We'll remove NG tube. GI seems to think that this could've been related to a gastroenteritis. Also gastroparesis is a possibility patient is not a diabetic Patient will have a EGD tomorrow  2.   HTN (hypertension) - blood pressure is currently stable home medications on hold  3.  RLS (restless legs syndrome) - continue Requip      Code Status Orders         Start     Ordered   01/18/16 0126  Full code  Continuous     01/18/16 0125    Code Status History    Date Active Date Inactive Code Status Order ID Comments User Context   This patient has a current code status but no historical code status.           Consults  gastroenterology   DVT Prophylaxis  Lovenox  Lab Results  Component Value Date   PLT 335 01/18/2016     Time Spent in minutes   75min  Greater than 50% of time spent in care coordination and counseling patient regarding the condition and plan of care.   Dustin Flock M.D on 01/18/2016 at 1:10 PM  Between 7am to 6pm - Pager - [phone]  After 6pm go to [email or web address] - password [email or web address] [facility]   Office  [phone]

## 2016-01-18 NOTE — ED Notes (Signed)
NG tube advanced additional 8 cm per xr recommendation.

## 2016-01-18 NOTE — Consult Note (Signed)
Jonathon Bellows MD  [address] [place], [place] [address] Phone: [phone] Fax : [phone]  Consultation  Referring Provider:     No ref. provider found Primary Care Physician:  [place] Not In System Primary Gastroenterologist:  Dr. Vicente Males         Reason for Consultation:     Gastric distension   Date of Admission:  01/17/2016 Date of Consultation:  01/18/2016         HPI:   Suzanne Stout is a 41 y.o. female was admitted on 01/17/16 with abdominal pain, distension of 4 days duration . Hb 14 , plt 451. CT abdomen showed moderate distension of stomach , no inflammation seen. NG tube placed 7 hours back.   She says that on [birth year] , woke up feeling discomfort in her abdomen , following which had nausea,vomiting and diarrhea. Multiple episodes during the day , continued which brought her into the hospital. She says that she had a bad cold prior to that and was commenced on amoxicillin , took a few advils too . She came into the hospital , NG tube placed and subsequently had no nausea/vomiting or diarrhea. Does not feel hungry as yet .    Past Medical History:  Diagnosis Date  . Hypertension   . Psoriasis   . RLS (restless legs syndrome)     Past Surgical History:  Procedure Laterality Date  . BREAST LUMPECTOMY      Prior to Admission medications   Medication Sig Start Date End Date Taking? Authorizing Provider  amoxicillin-clavulanate (AUGMENTIN) 875-125 MG tablet Take 1 tablet by mouth 2 (two) times daily. 01/11/16 01/21/16 Yes Historical Provider, MD  lisinopril-hydrochlorothiazide (PRINZIDE,ZESTORETIC) 20-25 MG tablet Take 1 tablet by mouth daily.   Yes Historical Provider, MD  rOPINIRole (REQUIP) 0.25 MG tablet Take 0.25 mg by mouth at bedtime.   Yes Historical Provider, MD  zolpidem (AMBIEN) 10 MG tablet Take 10 mg by mouth at bedtime.   Yes Historical Provider, MD  methylPREDNISolone (MEDROL DOSEPAK) 4 MG TBPK tablet Take by mouth.    Historical Provider, MD     Family History  Problem Relation Age of Onset  . Gallbladder disease Other      Social History  Substance Use Topics  . Smoking status: Never Smoker  . Smokeless tobacco: Never Used  . Alcohol use No    Allergies as of 01/17/2016 - Review Complete 01/17/2016  Allergen Reaction Noted  . Latex Rash 11/05/2015    Review of Systems:    All systems reviewed and negative except where noted in HPI.   Physical Exam:  Vital signs in last 24 hours: Temp:  [97.7 F (36.5 C)-98.4 F (36.9 C)] 97.7 F (36.5 C) (01/03 0358) Pulse Rate:  [75-108] 75 (01/03 0358) Resp:  [17-22] 22 (01/03 0358) BP: (96-109)/(51-72) 96/51 (01/03 0358) SpO2:  [98 %-99 %] 98 % (01/03 0358) Weight:  [155 lb (70.3 kg)-158 lb 6.4 oz (71.8 kg)] 158 lb 6.4 oz (71.8 kg) (01/03 0053) Last BM Date: 01/16/16 (per pt report) General:   Pleasant, cooperative in NAD Head:  Normocephalic and atraumatic.NG tube seen at nostril Eyes:   No icterus.   Conjunctiva pink. PERRLA. Ears:  Normal auditory acuity. Neck:  Supple; no masses or thyroidomegaly Lungs: Respirations even and unlabored. Lungs clear to auscultation bilaterally.   No wheezes, crackles, or rhonchi.  Heart:  Regular rate and rhythm;  Without murmur, clicks, rubs or gallops Abdomen:  Soft, nondistended, nontender. Normal bowel sounds. No  appreciable masses or hepatomegaly.  No rebound or guarding.  Neurologic:  Alert and oriented x3;  grossly normal neurologically. Skin:  Intact without significant lesions or rashes. Cervical Nodes:  No significant cervical adenopathy. Psych:  Alert and cooperative. Normal affect.  LAB RESULTS:  Recent Labs  01/17/16 1920 01/18/16 0626  WBC 19.3* 11.7*  HGB 14.0 11.9*  HCT 40.7 34.1*  PLT 451* 335   BMET  Recent Labs  01/17/16 1920 01/18/16 0626  NA 135 137  K 3.8 3.2*  CL 96* 104  CO2 29 29  GLUCOSE 107* 88  BUN 15 11  CREATININE 0.95 0.95  CALCIUM 9.9 8.5*   LFT  Recent Labs  01/17/16 1920   PROT 8.3*  ALBUMIN 4.2  AST 20  ALT 20  ALKPHOS 72  BILITOT 0.6   PT/INR No results for input(s): LABPROT, INR in the last 72 hours.  STUDIES: Dg Chest 2 View  Result Date: 01/17/2016 CLINICAL DATA:  Epigastric pain with chest pressure EXAM: CHEST  2 VIEW COMPARISON:  None. FINDINGS: No acute pulmonary infiltrate, consolidation, or pleural effusion. Normal heart size. No pneumothorax. Moderate scoliosis of the spine. IMPRESSION: No active cardiopulmonary disease. Electronically Signed   By: Donavan Foil M.D.   On: 01/17/2016 20:33   Ct Abdomen Pelvis W Contrast  Result Date: 01/17/2016 CLINICAL DATA:  41 y/o F; epigastric pain with chest pressure for 3-4 days associated with nausea and vomiting. EXAM: CT ABDOMEN AND PELVIS WITH CONTRAST TECHNIQUE: Multidetector CT imaging of the abdomen and pelvis was performed using the standard protocol following bolus administration of intravenous contrast. CONTRAST:  12mL ISOVUE-300 IOPAMIDOL (ISOVUE-300) INJECTION 61% COMPARISON:  None. FINDINGS: Lower chest: No acute abnormality. Hepatobiliary: No focal liver abnormality is seen. No gallstones, gallbladder wall thickening, or biliary dilatation. Pancreas: Unremarkable. No pancreatic ductal dilatation or surrounding inflammatory changes. Spleen: Normal in size without focal abnormality. Adrenals/Urinary Tract: Adrenal glands are unremarkable. Small left kidney measuring up to 70 mm compared with the right which measures 106 mm, possibly related atrophy or normal variant. Kidneys are otherwise normal, without renal calculi, focal lesion, or hydronephrosis. Bladder is unremarkable. Stomach/Bowel: Stomach is moderately distended. Appendix appears normal. No evidence of bowel wall thickening, distention, or inflammatory changes. Vascular/Lymphatic: No significant vascular findings are present. No enlarged abdominal or pelvic lymph nodes. Reproductive: Left-sided adnexal cysts measuring up to 27 mm with benign  features. Low-lying intrauterine device. Uterine fundal hyperdense focus measuring up to 30 mm is likely to represent a myoma. Other: No abdominal wall hernia or abnormality. No abdominopelvic ascites. Musculoskeletal: Mild levocurvature of the spine with apex at L1. IMPRESSION: 1. Moderate gastric distention. 2. Small left kidney possibly related atrophy or normal variant. 3. Uterine myoma. 4. Otherwise unremarkable CT of the abdomen and pelvis. Electronically Signed   By: Kristine Garbe M.D.   On: 01/17/2016 22:28   Dg Abd Portable 1v  Result Date: 01/18/2016 CLINICAL DATA:  Nasogastric tube reinsertion. EXAM: PORTABLE ABDOMEN - 1 VIEW COMPARISON:  01/17/2016 FINDINGS: The nasogastric tube extends into the stomach and curls up into the fundus. IMPRESSION: Nasogastric tube extends well into the stomach. Electronically Signed   By: Andreas Newport M.D.   On: 01/18/2016 01:57   Dg Abd Portable 1 View  Result Date: 01/17/2016 CLINICAL DATA:  Initial evaluation for NG tube placement. EXAM: PORTABLE ABDOMEN - 1 VIEW COMPARISON:  Prior CT from 1 day earlier the same day. FINDINGS: An enteric tube is in place, with tip overlying the  stomach, at or just beyond the GE junction. Side hole is in the distal esophagus. Consider advancement by 7-8 cm to insure adequate placement within the stomach. Bowel gas pattern within normal limits. Secrete IV contrast material present within the renal collecting systems bilaterally. Scoliosis noted.  Visualized lung bases are clear. IMPRESSION: Enteric tube in place with tip at the level of the GE junction, and side hole in the distal esophagus. Consider advancement by 7-8 cm to insure adequate placement within the stomach. Electronically Signed   By: Jeannine Boga M.D.   On: 01/17/2016 23:52      Impression / Plan:   Suzanne Stout is a 41 y.o. y/o female with acute onset of nausea,vomiting,abdominal pain and diarrhea which has resolved after coming into  the hospital after 4 days. Ct scan shows moderate gastric distension with differentials including post infectious gastric dysmotility vs out let obstruction from ulcers .   Plan :  1. Keep NPO, continue NG tube suction intermittent, if better today can take NG tube off .  2. EGD tomorrow morning  3. PPI.  4. No NSAID;s  I have discussed alternative options, risks & benefits,  which include, but are not limited to, bleeding, infection, perforation,respiratory complication & drug reaction.  The patient agrees with this plan & written consent will be obtained.     Thank you for involving me in the care of this patient.      LOS: 1 day   Jonathon Bellows, MD  01/18/2016, 8:46 AM

## 2016-01-18 NOTE — Plan of Care (Signed)
Problem: Education: Goal: Knowledge of [facility] Education information/materials will improve Outcome: Progressing Oriented pt to unit, including call bell, unit routine.  Pt reported NGT "fell out" after arriving to unit, new 14Fr NGT placed @62cm .  Dr. Jannifer Franklin paged, CXR verified placement in stomach, NGT on LIWS.  Denies pain, reports throat discomfort.  Reported nausea, Dr. Marcille Blanco paged, received IV Compazine.  No other complaints, pt sleeping.  Call bell within reach, [place].

## 2016-01-19 ENCOUNTER — Inpatient Hospital Stay: Payer: BLUE CROSS/BLUE SHIELD | Admitting: Anesthesiology

## 2016-01-19 ENCOUNTER — Encounter: Payer: Self-pay | Admitting: *Deleted

## 2016-01-19 ENCOUNTER — Encounter
Admission: EM | Disposition: A | Discharge status: Home / Self Care | Payer: Self-pay | Source: Home / Self Care | Attending: Internal Medicine

## 2016-01-19 DIAGNOSIS — R1084 Generalized abdominal pain: Secondary | ICD-10-CM

## 2016-01-19 HISTORY — PX: ESOPHAGOGASTRODUODENOSCOPY (EGD) WITH PROPOFOL: SHX5813

## 2016-01-19 LAB — BASIC METABOLIC PANEL
Anion gap: 6 (ref 5–15)
BUN: 14 mg/dL (ref 6–20)
CO2: 25 mmol/L (ref 22–32)
Calcium: 8.8 mg/dL — ABNORMAL LOW (ref 8.9–10.3)
Chloride: 106 mmol/L (ref 101–111)
Creatinine, Ser: 0.83 mg/dL (ref 0.44–1.00)
GFR calc Af Amer: >60 mL/min (ref >60)
GFR calc non Af Amer: >60 mL/min (ref >60)
Glucose, Bld: 79 mg/dL (ref 65–99)
Potassium: 3.5 mmol/L (ref 3.5–5.1)
SODIUM: 137 mmol/L (ref 135–145)

## 2016-01-19 LAB — CBC
HEMATOCRIT: 33.5 % — AB (ref 35.0–47.0)
HEMOGLOBIN: 11.7 g/dL — AB (ref 12.0–16.0)
MCH: 32.2 pg (ref 26.0–34.0)
MCHC: 34.8 g/dL (ref 32.0–36.0)
MCV: 92.3 fL (ref 80.0–100.0)
Platelets: 322 10*3/uL (ref 150–440)
RBC: 3.63 MIL/uL — ABNORMAL LOW (ref 3.80–5.20)
RDW: 12.3 % (ref 11.5–14.5)
WBC: 8.6 10*3/uL (ref 3.6–11.0)

## 2016-01-19 SURGERY — ESOPHAGOGASTRODUODENOSCOPY (EGD) WITH PROPOFOL
Anesthesia: General

## 2016-01-19 MED ORDER — FENTANYL CITRATE (PF) 100 MCG/2ML IJ SOLN
INTRAMUSCULAR | Status: DC | PRN
  Administered 2016-01-19: 50 ug via INTRAVENOUS

## 2016-01-19 MED ORDER — OMEPRAZOLE 40 MG PO CPDR: 40.0000 mg | DELAYED_RELEASE_CAPSULE | Freq: Every day | ORAL | 2 refills | Status: DC

## 2016-01-19 MED ORDER — PROPOFOL 500 MG/50ML IV EMUL
INTRAVENOUS | Status: AC
Start: 2016-01-19 — End: 2016-01-19
  Filled 2016-01-19: qty 50

## 2016-01-19 MED ORDER — LIDOCAINE 2% (20 MG/ML) 5 ML SYRINGE
INTRAMUSCULAR | Status: AC
  Filled 2016-01-19: qty 5

## 2016-01-19 MED ORDER — LIDOCAINE HCL (CARDIAC) 20 MG/ML IV SOLN
INTRAVENOUS | Status: DC | PRN
  Administered 2016-01-19: 45 mg via INTRAVENOUS

## 2016-01-19 MED ORDER — MIDAZOLAM HCL 2 MG/2ML IJ SOLN
INTRAMUSCULAR | Status: AC
  Filled 2016-01-19: qty 2

## 2016-01-19 MED ORDER — FENTANYL CITRATE (PF) 100 MCG/2ML IJ SOLN
INTRAMUSCULAR | Status: AC
  Filled 2016-01-19: qty 2

## 2016-01-19 MED ORDER — SODIUM CHLORIDE 0.9 % IV SOLN
INTRAVENOUS | Status: DC | PRN
  Administered 2016-01-19: 09:00:00 via INTRAVENOUS

## 2016-01-19 MED ORDER — SODIUM CHLORIDE 0.9 % IV SOLN
INTRAVENOUS | Status: DC
  Administered 2016-01-19: 08:00:00 via INTRAVENOUS

## 2016-01-19 MED ORDER — MIDAZOLAM HCL 2 MG/2ML IJ SOLN
INTRAMUSCULAR | Status: DC | PRN
  Administered 2016-01-19: 2 mg via INTRAVENOUS

## 2016-01-19 MED ORDER — PROPOFOL 10 MG/ML IV BOLUS
INTRAVENOUS | Status: DC | PRN
  Administered 2016-01-19: 10 mg via INTRAVENOUS
  Administered 2016-01-19: 50 mg via INTRAVENOUS

## 2016-01-19 MED ORDER — PROPOFOL 500 MG/50ML IV EMUL
INTRAVENOUS | Status: DC | PRN
  Administered 2016-01-19: 150 ug/kg/min via INTRAVENOUS

## 2016-01-19 NOTE — Progress Notes (Signed)
Discharge paperwork reviewed with patient who verbalized understanding. New prescription reviewed with patient with education provided. Patient is stable for discharge, tolerated diet. Patient's husband to transport home.

## 2016-01-19 NOTE — Anesthesia Postprocedure Evaluation (Signed)
Anesthesia Post Note  Patient: Suzanne Stout  Procedure(s) Performed: Procedure(s) (LRB): ESOPHAGOGASTRODUODENOSCOPY (EGD) WITH PROPOFOL (N/A)  Patient location during evaluation: Endoscopy Anesthesia Type: General Level of consciousness: awake and alert Pain management: pain level controlled Vital Signs Assessment: post-procedure vital signs reviewed and stable Respiratory status: spontaneous breathing, nonlabored ventilation, respiratory function stable and patient connected to nasal cannula oxygen Cardiovascular status: blood pressure returned to baseline and stable Postop Assessment: no signs of nausea or vomiting Anesthetic complications: no     Last Vitals:  Vitals:   01/19/16 0948 01/19/16 0958  BP: (!) 118/92 106/66  Pulse: 91 93  Resp: (!) 22 (!) 23  Temp:      Last Pain:  Vitals:   01/19/16 0928  TempSrc: Tympanic  PainSc: 0-No pain                 Martha Clan

## 2016-01-19 NOTE — Anesthesia Procedure Notes (Signed)
Date/Time: 01/19/2016 9:13 AM Performed by: Doreen Salvage Pre-anesthesia Checklist: Patient identified, Emergency Drugs available, Suction available and Patient being monitored Patient Re-evaluated:Patient Re-evaluated prior to inductionOxygen Delivery Method: Nasal cannula Intubation Type: IV induction Dental Injury: Teeth and Oropharynx as per pre-operative assessment  Comments: Nasal cannula with etCO2 monitoring

## 2016-01-19 NOTE — Progress Notes (Signed)
EGD  1. Moderate antral gastritis bx taken  2. Two ulcers post pyloryus in the bulb opposite each other "kissing ulcers" , may have caused a temporary gastric outflow obtsruction. They are healing, non bleeding and not obtsructing presently   Plan   1. No NSAID's for atleast 6 weeks 2. Omeprazole 40 mg daily for 8 weeks 3. Repeat EGD in 6 weeks to check healing  4. OP GI follow up 5. Check H pylori stool antigen  6. Can go home if tolerating PO today from GI point of view.   Dr Jonathon Bellows  Gastroenterology/Hepatology Pager: [phone]

## 2016-01-19 NOTE — Transfer of Care (Signed)
Immediate Anesthesia Transfer of Care Note  Patient: Suzanne Stout  Procedure(s) Performed: Procedure(s): ESOPHAGOGASTRODUODENOSCOPY (EGD) WITH PROPOFOL (N/A)  Patient Location: PACU and Endoscopy Unit  Anesthesia Type:General  Level of Consciousness: sedated  Airway & Oxygen Therapy: Patient Spontanous Breathing and Patient connected to nasal cannula oxygen  Post-op Assessment: Report given to RN and Post -op Vital signs reviewed and stable  Post vital signs: Reviewed and stable  Last Vitals:  Vitals:   01/19/16 0814 01/19/16 0928  BP: 105/60 109/69  Pulse: 83 96  Resp: 16 16  Temp: (!) 35.8 C [id number] C    Complications: No apparent anesthesia complications

## 2016-01-19 NOTE — Brief Op Note (Signed)
Transported via bed to inpt. Room 130. Suzanne Stout, orderly, with pt.

## 2016-01-19 NOTE — Op Note (Signed)
[facility]astroenterology Patient Name: Suzanne Stout Procedure Date: 01/19/2016 9:13 AM MRN: [id number] Account #: [id number] Date of Birth: Jul 22, 1975 Admit Type: Inpatient Age: 41 Room: [facility] ENDO ROOM 4 Gender: Female Note Status: Finalized Procedure:            Upper GI endoscopy Indications:          Abnormal CT of the GI tract Providers:            Jonathon Bellows MD, MD Referring MD:         No Local Md, MD (Referring MD) Medicines:            Monitored Anesthesia Care Complications:        No immediate complications. Procedure:            Pre-Anesthesia Assessment:                       - Prior to the procedure, a History and Physical was                        performed, and patient medications, allergies and                        sensitivities were reviewed. The patient's tolerance of                        previous anesthesia was reviewed.                       - The risks and benefits of the procedure and the                        sedation options and risks were discussed with the                        patient. All questions were answered and informed                        consent was obtained.                       - The risks and benefits of the procedure and the                        sedation options and risks were discussed with the                        patient. All questions were answered and informed                        consent was obtained.                       - ASA Grade Assessment: II - A patient with mild                        systemic disease.                       After obtaining informed consent, the endoscope was  passed under direct vision. Throughout the procedure,                        the patient's blood pressure, pulse, and oxygen                        saturations were monitored continuously. The Endoscope                        was introduced through the mouth, and advanced to the            third part of duodenum. The upper GI endoscopy was                        accomplished with ease. The patient tolerated the                        procedure well. Findings:      The esophagus was normal.      Scattered moderate inflammation characterized by adherent blood,       congestion (edema), erosions and erythema was found in the gastric       antrum. Biopsies were taken with a cold forceps for histology.      Two non-bleeding cratered duodenal ulcers with a clean ulcer base       (Forrest Class III) were found in the duodenal bulb. The largest lesion       was 8 mm in largest dimension. the two ulcers were opposite each other       "kissing ulcers" just past the pylorus Impression:           - Normal esophagus.                       - Gastritis. Biopsied.                       - Multiple non-bleeding duodenal ulcers with a clean                        ulcer base (Forrest Class III). Recommendation:       - Discharge patient to home (with escort).                       - Advance diet as tolerated today.                       - No aspirin, ibuprofen, naproxen, or other                        non-steroidal anti-inflammatory drugs for 6 weeks.                       - Await pathology results.                       - Use Prilosec (omeprazole) 40 mg PO daily for 6 weeks.                       - Return to my office in 4 weeks.                       -  Repeat upper endoscopy in 6 weeks to check healing. Procedure Code(s):    --- Professional ---                       [phone], Esophagogastroduodenoscopy, flexible, transoral;                        with biopsy, single or multiple Diagnosis Code(s):    --- Professional ---                       K29.70, Gastritis, unspecified, without bleeding                       K26.9, Duodenal ulcer, unspecified as acute or chronic,                        without hemorrhage or perforation                       R93.3, Abnormal findings on  diagnostic imaging of other                        parts of digestive tract CPT copyright 2016 American Medical Association. All rights reserved. The codes documented in this report are preliminary and upon coder review may  be revised to meet current compliance requirements. Jonathon Bellows, MD Jonathon Bellows MD, MD 01/19/2016 9:26:33 AM This report has been signed electronically. Number of Addenda: 0 Note Initiated On: 01/19/2016 9:13 AM      [facility]

## 2016-01-19 NOTE — H&P (Signed)
  Jonathon Bellows MD [address] [place], [place] [address] Phone: [phone] Fax : [phone]  Primary Care Physician:  Pcp Not In System Primary Gastroenterologist:  Dr. Jonathon Bellows   Pre-Procedure History & Physical: HPI:  Suzanne Stout is a 41 y.o. female is here for an endoscopy.   Past Medical History:  Diagnosis Date  . Hypertension   . Psoriasis   . RLS (restless legs syndrome)     Past Surgical History:  Procedure Laterality Date  . BREAST LUMPECTOMY      Prior to Admission medications   Medication Sig Start Date End Date Taking? Authorizing Provider  amoxicillin-clavulanate (AUGMENTIN) 875-125 MG tablet Take 1 tablet by mouth 2 (two) times daily. 01/11/16 01/21/16 Yes Historical Provider, MD  lisinopril-hydrochlorothiazide (PRINZIDE,ZESTORETIC) 20-25 MG tablet Take 1 tablet by mouth daily.   Yes Historical Provider, MD  rOPINIRole (REQUIP) 0.25 MG tablet Take 0.25 mg by mouth at bedtime.   Yes Historical Provider, MD  zolpidem (AMBIEN) 10 MG tablet Take 10 mg by mouth at bedtime.   Yes Historical Provider, MD  methylPREDNISolone (MEDROL DOSEPAK) 4 MG TBPK tablet Take by mouth.    Historical Provider, MD    Allergies as of 01/17/2016 - Review Complete 01/17/2016  Allergen Reaction Noted  . Latex Rash 11/05/2015    Family History  Problem Relation Age of Onset  . Gallbladder disease Other     Social History   Social History  . Marital status: Married    Spouse name: N/A  . Number of children: N/A  . Years of education: N/A   Occupational History  . Not on file.   Social History Main Topics  . Smoking status: Never Smoker  . Smokeless tobacco: Never Used  . Alcohol use No  . Drug use: Unknown  . Sexual activity: Not on file   Other Topics Concern  . Not on file   Social History Narrative  . No narrative on file    Review of Systems: See HPI, otherwise negative ROS  Physical Exam: BP 105/60   Pulse 83   Temp (!) 96.5 F (35.8 C)  (Tympanic)   Resp 16   Ht 5\' 4"  (1.626 m)   Wt 158 lb (71.7 kg)   SpO2 100%   BMI 27.12 kg/m  General:   Alert,  pleasant and cooperative in NAD Head:  Normocephalic and atraumatic. Neck:  Supple; no masses or thyromegaly. Lungs:  Clear throughout to auscultation.    Heart:  Regular rate and rhythm. Abdomen:  Soft, nontender and nondistended. Normal bowel sounds, without guarding, and without rebound.   Neurologic:  Alert and  oriented x4;  grossly normal neurologically.  Impression/Plan: Suzanne Stout is here for an endoscopy to be performed for gastric distension seen on CT scan of the abdomen . Overnight feels much better able to tolerate clears  Risks, benefits, limitations, and alternatives regarding  endoscopy have been reviewed with the patient.  Questions have been answered.  All parties agreeable.   Jonathon Bellows, MD  01/19/2016, 9:05 AM

## 2016-01-19 NOTE — Anesthesia Preprocedure Evaluation (Signed)
Anesthesia Evaluation  Patient identified by MRN, date of birth, ID band Patient awake    Reviewed: Allergy & Precautions, H&P , NPO status , Patient's Chart, lab work & pertinent test results, reviewed documented beta blocker date and time   History of Anesthesia Complications (+) PONV, PROLONGED EMERGENCE and history of anesthetic complications  Airway Mallampati: I  TM Distance: >3 FB Neck ROM: full    Dental  (+) Caps, Teeth Intact, Dental Advidsory Given   Pulmonary neg shortness of breath, neg sleep apnea, neg COPD, Recent URI , Resolved,    Pulmonary exam normal breath sounds clear to auscultation       Cardiovascular Exercise Tolerance: Good hypertension, (-) angina(-) CAD, (-) Past MI, (-) Cardiac Stents and (-) CABG Normal cardiovascular exam(-) dysrhythmias (-) Valvular Problems/Murmurs Rhythm:regular Rate:Normal     Neuro/Psych negative neurological ROS  negative psych ROS   GI/Hepatic negative GI ROS, Neg liver ROS,   Endo/Other  negative endocrine ROS  Renal/GU negative Renal ROS  negative genitourinary   Musculoskeletal   Abdominal   Peds  Hematology negative hematology ROS (+)   Anesthesia Other Findings Past Medical History: No date: Hypertension No date: Psoriasis No date: RLS (restless legs syndrome)   Reproductive/Obstetrics negative OB ROS                             Anesthesia Physical Anesthesia Plan  ASA: II  Anesthesia Plan: General   Post-op Pain Management:    Induction:   Airway Management Planned:   Additional Equipment:   Intra-op Plan:   Post-operative Plan:   Informed Consent: I have reviewed the patients History and Physical, chart, labs and discussed the procedure including the risks, benefits and alternatives for the proposed anesthesia with the patient or authorized representative who has indicated his/her understanding and acceptance.    Dental Advisory Given  Plan Discussed with: Anesthesiologist, CRNA and Surgeon  Anesthesia Plan Comments:         Anesthesia Quick Evaluation

## 2016-01-20 LAB — SURGICAL PATHOLOGY

## 2016-01-20 NOTE — Discharge Summary (Signed)
[name] at [facility], [place] y.o., DOB 1975/02/07, MRN [id number]. Admission date: 01/17/2016 Discharge Date 01/20/2016 Primary MD Pcp Not In System Admitting Physician Lance Coon, MD  Admission Diagnosis  Gastric distention [K31.89] Abdominal pain, unspecified abdominal location [R10.9]  Discharge Diagnosis   Principal Problem:   Abdominal pain   Gastritis   Gastric ulcer   Duodenal ulcers   HTN (hypertension)   RLS (restless legs syndrome)         Suzanne Stout  is a 41 y.o. female who presents with Abdominal pain and distention for the past 4 days. Patient states that over this 4 days the pain has gotten worse, initially was sharp in nature epigastric and left upper quadrant, but now includes significant distention pain as well. Patient was evaluated in the ED and the CT scan showed gastric distention. She had a NG tube placed with decompression. She was seen in consultation by GI who did an endoscopy which showed multiple ulcers as above. It was felt that there was adhesions of these ulcers and could've caused obstruction. Patient does take NSAIDs and she was told not to take NSAIDs she'll follow up with GI as outpatient for further evaluation. She'll be placed on PPIs.           Consults  GI  Significant Tests:  See full reports for all details     Dg Chest 2 View  Result Date: 01/17/2016 CLINICAL DATA:  Epigastric pain with chest pressure EXAM: CHEST  2 VIEW COMPARISON:  None. FINDINGS: No acute pulmonary infiltrate, consolidation, or pleural effusion. Normal heart size. No pneumothorax. Moderate scoliosis of the spine. IMPRESSION: No active cardiopulmonary disease. Electronically Signed   By: Donavan Foil M.D.   On: 01/17/2016 20:33   Ct Abdomen Pelvis W Contrast  Result Date: 01/17/2016 CLINICAL DATA:  41 y/o F; epigastric pain with chest pressure for 3-4 days associated with nausea and vomiting. EXAM:  CT ABDOMEN AND PELVIS WITH CONTRAST TECHNIQUE: Multidetector CT imaging of the abdomen and pelvis was performed using the standard protocol following bolus administration of intravenous contrast. CONTRAST:  11mL ISOVUE-300 IOPAMIDOL (ISOVUE-300) INJECTION 61% COMPARISON:  None. FINDINGS: Lower chest: No acute abnormality. Hepatobiliary: No focal liver abnormality is seen. No gallstones, gallbladder wall thickening, or biliary dilatation. Pancreas: Unremarkable. No pancreatic ductal dilatation or surrounding inflammatory changes. Spleen: Normal in size without focal abnormality. Adrenals/Urinary Tract: Adrenal glands are unremarkable. Small left kidney measuring up to 70 mm compared with the right which measures 106 mm, possibly related atrophy or normal variant. Kidneys are otherwise normal, without renal calculi, focal lesion, or hydronephrosis. Bladder is unremarkable. Stomach/Bowel: Stomach is moderately distended. Appendix appears normal. No evidence of bowel wall thickening, distention, or inflammatory changes. Vascular/Lymphatic: No significant vascular findings are present. No enlarged abdominal or pelvic lymph nodes. Reproductive: Left-sided adnexal cysts measuring up to 27 mm with benign features. Low-lying intrauterine device. Uterine fundal hyperdense focus measuring up to 30 mm is likely to represent a myoma. Other: No abdominal wall hernia or abnormality. No abdominopelvic ascites. Musculoskeletal: Mild levocurvature of the spine with apex at L1. IMPRESSION: 1. Moderate gastric distention. 2. Small left kidney possibly related atrophy or normal variant. 3. Uterine myoma. 4. Otherwise unremarkable CT of the abdomen and pelvis. Electronically Signed   By: Kristine Garbe M.D.   On: 01/17/2016 22:28   Dg Abd Portable 1v  Result Date: 01/18/2016 CLINICAL DATA:  Nasogastric tube reinsertion. EXAM: PORTABLE ABDOMEN -  1 VIEW COMPARISON:  01/17/2016 FINDINGS: The nasogastric tube extends into the  stomach and curls up into the fundus. IMPRESSION: Nasogastric tube extends well into the stomach. Electronically Signed   By: Andreas Newport M.D.   On: 01/18/2016 01:57   Dg Abd Portable 1 View  Result Date: 01/17/2016 CLINICAL DATA:  Initial evaluation for NG tube placement. EXAM: PORTABLE ABDOMEN - 1 VIEW COMPARISON:  Prior CT from 1 day earlier the same day. FINDINGS: An enteric tube is in place, with tip overlying the stomach, at or just beyond the GE junction. Side hole is in the distal esophagus. Consider advancement by 7-8 cm to insure adequate placement within the stomach. Bowel gas pattern within normal limits. Secrete IV contrast material present within the renal collecting systems bilaterally. Scoliosis noted.  Visualized lung bases are clear. IMPRESSION: Enteric tube in place with tip at the level of the GE junction, and side hole in the distal esophagus. Consider advancement by 7-8 cm to insure adequate placement within the stomach. Electronically Signed   By: Jeannine Boga M.D.   On: 01/17/2016 23:52       Today   Subjective:   Suzanne Stout  Pt denies any abdominal pain  Objective:   Blood pressure (!) 104/52, pulse 81, temperature 98.5 F (36.9 C), temperature source Oral, resp. rate 16, height 5\' 4"  (1.626 m), weight 158 lb (71.7 kg), SpO2 100 %.  . No intake or output data in the 24 hours ending 01/20/16 1701  Exam VITAL SIGNS: Blood pressure (!) 104/52, pulse 81, temperature 98.5 F (36.9 C), temperature source Oral, resp. rate 16, height 5\' 4"  (1.626 m), weight 158 lb (71.7 kg), SpO2 100 %.  GENERAL:  41 y.o.-year-old patient lying in the bed with no acute distress.  EYES: Pupils equal, round, reactive to light and accommodation. No scleral icterus. Extraocular muscles intact.  HEENT: Head atraumatic, normocephalic. Oropharynx and nasopharynx clear.  NECK:  Supple, no jugular venous distention. No thyroid enlargement, no tenderness.  LUNGS: Normal breath  sounds bilaterally, no wheezing, rales,rhonchi or crepitation. No use of accessory muscles of respiration.  CARDIOVASCULAR: S1, S2 normal. No murmurs, rubs, or gallops.  ABDOMEN: Soft, nontender, nondistended. Bowel sounds present. No organomegaly or mass.  EXTREMITIES: No pedal edema, cyanosis, or clubbing.  NEUROLOGIC: Cranial nerves II through XII are intact. Muscle strength 5/5 in all extremities. Sensation intact. Gait not checked.  PSYCHIATRIC: The patient is alert and oriented x 3.  SKIN: No obvious rash, lesion, or ulcer.   Data Review     CBC w Diff:  Lab Results  Component Value Date   WBC 8.6 01/19/2016   HGB 11.7 (L) 01/19/2016   HCT 33.5 (L) 01/19/2016   PLT 322 01/19/2016   LYMPHOPCT 16 11/05/2015   MONOPCT 4 11/05/2015   EOSPCT 1 11/05/2015   BASOPCT 1 11/05/2015   CMP:  Lab Results  Component Value Date   NA 137 01/19/2016   K 3.5 01/19/2016   CL 106 01/19/2016   CO2 25 01/19/2016   BUN 14 01/19/2016   CREATININE 0.83 01/19/2016   PROT 8.3 (H) 01/17/2016   ALBUMIN 4.2 01/17/2016   BILITOT 0.6 01/17/2016   ALKPHOS 72 01/17/2016   AST 20 01/17/2016   ALT 20 01/17/2016  .  Micro Results No results found for this or any previous visit (from the past 240 hour(s)).   Code Status History    Date Active Date Inactive Code Status Order ID Comments User Context  01/18/2016  1:25 AM 01/19/2016  5:32 PM Full Code [id number]  Lance Coon, MD Inpatient          Follow-up Information    Jonathon Bellows, MD On 02/06/2016.   Specialty:  Surgery Why:  at 1:45 p.m. in [facility] information: [address] [id number] [phone]           Discharge Medications   Allergies as of 01/19/2016      Reactions   Latex Rash      Medication List    TAKE these medications   amoxicillin-clavulanate 875-125 MG tablet Commonly known as:  AUGMENTIN Take 1 tablet by mouth 2 (two) times daily.   lisinopril-hydrochlorothiazide  20-25 MG tablet Commonly known as:  PRINZIDE,ZESTORETIC Take 1 tablet by mouth daily.   methylPREDNISolone 4 MG Tbpk tablet Commonly known as:  MEDROL DOSEPAK Take by mouth.   omeprazole 40 MG capsule Commonly known as:  PRILOSEC Take 1 capsule (40 mg total) by mouth daily.   rOPINIRole 0.25 MG tablet Commonly known as:  REQUIP Take 0.25 mg by mouth at bedtime.   zolpidem 10 MG tablet Commonly known as:  AMBIEN Take 10 mg by mouth at bedtime.          Total Time in preparing paper work, data evaluation and todays exam - 35 minutes  Dustin Flock M.D on 01/20/2016 at 5:01 PM  [facility]   Office  [phone]

## 2016-01-24 ENCOUNTER — Encounter: Payer: Self-pay | Admitting: Gastroenterology

## 2016-02-06 ENCOUNTER — Encounter: Payer: Self-pay | Admitting: Gastroenterology

## 2016-02-06 ENCOUNTER — Ambulatory Visit (INDEPENDENT_AMBULATORY_CARE_PROVIDER_SITE_OTHER): Payer: BLUE CROSS/BLUE SHIELD | Admitting: Gastroenterology

## 2016-02-06 VITALS — BP 108/68 | HR 89 | Temp 98.2°F | Ht 64.0 in | Wt 158.0 lb

## 2016-02-06 DIAGNOSIS — K269 Duodenal ulcer, unspecified as acute or chronic, without hemorrhage or perforation: Secondary | ICD-10-CM

## 2016-02-06 NOTE — Patient Instructions (Addendum)
EGD TO BE SCHEDULE FOR 03/05/16 AM LABS ORDERED TODAY.

## 2016-02-06 NOTE — Progress Notes (Signed)
Primary Care Physician: Pcp Not In System  Primary Gastroenterologist:  Dr. Jonathon Bellows   Chief Complaint  Patient presents with  . Follow-up   She is here today to follow up for her recent EGD which I performed when she was admitted for abdominal pain and discharged on 01/19/16 . Her symptoms  resolved after coming into the hospital after 4 days. Ct scan shows moderate gastric distension.   EGD showed gastritis and 2 non bleeding superficial "kissing ulcers " in the duodenal bulb- no obstruction seen.  Biopsies of gastric antrum showed no H pylori and only minimal chronic gastritis.   Interval history 01/19/16-02/06/16   She says she using alleves and advils few days a week for a few months, now has stopped. On  Omeprazole .no abdominal pain , no vomiting or any other symptoms.    Current Outpatient Prescriptions  Medication Sig Dispense Refill  . lisinopril-hydrochlorothiazide (PRINZIDE,ZESTORETIC) 20-25 MG tablet Take 1 tablet by mouth daily.    Marland Kitchen omeprazole (PRILOSEC) 40 MG capsule Take 1 capsule (40 mg total) by mouth daily. 30 capsule 2  . rOPINIRole (REQUIP) 0.25 MG tablet Take 0.25 mg by mouth at bedtime.    Marland Kitchen zolpidem (AMBIEN) 10 MG tablet Take 10 mg by mouth at bedtime.     No current facility-administered medications for this visit.     Allergies as of 02/06/2016 - Review Complete 02/06/2016  Allergen Reaction Noted  . Latex Rash 11/05/2015    ROS:  General: Negative for anorexia, weight loss, fever, chills, fatigue, weakness. ENT: Negative for hoarseness, difficulty swallowing , nasal congestion. CV: Negative for chest pain, angina, palpitations, dyspnea on exertion, peripheral edema.  Respiratory: Negative for dyspnea at rest, dyspnea on exertion, cough, sputum, wheezing.  GI: See history of present illness. GU:  Negative for dysuria, hematuria, urinary incontinence, urinary frequency, nocturnal urination.  Endo: Negative for unusual weight change.    Physical  Examination:   BP 108/68   Pulse 89   Temp 98.2 F (36.8 C)   Ht 5\' 4"  (1.626 m)   Wt 158 lb (71.7 kg)   BMI 27.12 kg/m   General: Well-nourished, well-developed in no acute distress.  Eyes: No icterus. Conjunctivae pink. Mouth: Oropharyngeal mucosa moist and pink , no lesions erythema or exudate. Lungs: Clear to auscultation bilaterally. Non-labored. Heart: Regular rate and rhythm, no murmurs rubs or gallops.  Abdomen: Bowel sounds are normal, nontender, nondistended, no hepatosplenomegaly or masses, no abdominal bruits or hernia , no rebound or guarding.   Extremities: No lower extremity edema. No clubbing or deformities. Neuro: Alert and oriented x 3.  Grossly intact. Skin: Warm and dry, no jaundice.   Psych: Alert and cooperative, normal mood and affect.   Imaging Studies: Dg Chest 2 View  Result Date: 01/17/2016 CLINICAL DATA:  Epigastric pain with chest pressure EXAM: CHEST  2 VIEW COMPARISON:  None. FINDINGS: No acute pulmonary infiltrate, consolidation, or pleural effusion. Normal heart size. No pneumothorax. Moderate scoliosis of the spine. IMPRESSION: No active cardiopulmonary disease. Electronically Signed   By: Donavan Foil M.D.   On: 01/17/2016 20:33   Ct Abdomen Pelvis W Contrast  Result Date: 01/17/2016 CLINICAL DATA:  41 y/o F; epigastric pain with chest pressure for 3-4 days associated with nausea and vomiting. EXAM: CT ABDOMEN AND PELVIS WITH CONTRAST TECHNIQUE: Multidetector CT imaging of the abdomen and pelvis was performed using the standard protocol following bolus administration of intravenous contrast. CONTRAST:  124mL ISOVUE-300 IOPAMIDOL (ISOVUE-300) INJECTION 61%  COMPARISON:  None. FINDINGS: Lower chest: No acute abnormality. Hepatobiliary: No focal liver abnormality is seen. No gallstones, gallbladder wall thickening, or biliary dilatation. Pancreas: Unremarkable. No pancreatic ductal dilatation or surrounding inflammatory changes. Spleen: Normal in size  without focal abnormality. Adrenals/Urinary Tract: Adrenal glands are unremarkable. Small left kidney measuring up to 70 mm compared with the right which measures 106 mm, possibly related atrophy or normal variant. Kidneys are otherwise normal, without renal calculi, focal lesion, or hydronephrosis. Bladder is unremarkable. Stomach/Bowel: Stomach is moderately distended. Appendix appears normal. No evidence of bowel wall thickening, distention, or inflammatory changes. Vascular/Lymphatic: No significant vascular findings are present. No enlarged abdominal or pelvic lymph nodes. Reproductive: Left-sided adnexal cysts measuring up to 27 mm with benign features. Low-lying intrauterine device. Uterine fundal hyperdense focus measuring up to 30 mm is likely to represent a myoma. Other: No abdominal wall hernia or abnormality. No abdominopelvic ascites. Musculoskeletal: Mild levocurvature of the spine with apex at L1. IMPRESSION: 1. Moderate gastric distention. 2. Small left kidney possibly related atrophy or normal variant. 3. Uterine myoma. 4. Otherwise unremarkable CT of the abdomen and pelvis. Electronically Signed   By: Kristine Garbe M.D.   On: 01/17/2016 22:28   Dg Abd Portable 1v  Result Date: 01/18/2016 CLINICAL DATA:  Nasogastric tube reinsertion. EXAM: PORTABLE ABDOMEN - 1 VIEW COMPARISON:  01/17/2016 FINDINGS: The nasogastric tube extends into the stomach and curls up into the fundus. IMPRESSION: Nasogastric tube extends well into the stomach. Electronically Signed   By: Andreas Newport M.D.   On: 01/18/2016 01:57   Dg Abd Portable 1 View  Result Date: 01/17/2016 CLINICAL DATA:  Initial evaluation for NG tube placement. EXAM: PORTABLE ABDOMEN - 1 VIEW COMPARISON:  Prior CT from 1 day earlier the same day. FINDINGS: An enteric tube is in place, with tip overlying the stomach, at or just beyond the GE junction. Side hole is in the distal esophagus. Consider advancement by 7-8 cm to insure  adequate placement within the stomach. Bowel gas pattern within normal limits. Secrete IV contrast material present within the renal collecting systems bilaterally. Scoliosis noted.  Visualized lung bases are clear. IMPRESSION: Enteric tube in place with tip at the level of the GE junction, and side hole in the distal esophagus. Consider advancement by 7-8 cm to insure adequate placement within the stomach. Electronically Signed   By: Jeannine Boga M.D.   On: 01/17/2016 23:52    Assessment and Plan:   Suzanne Stout 41 y.o. female here today to follow up for duodenal ulcers seen on recent EGD when she was admitted with nausea,vomiting   Plan  1. H pylori stool antigen  2. Repeat EGD in 4 weeks to check for healing of the ulcer as etiology unclear , biopsies of stomach negative for H pylori.   I have discussed alternative options, risks & benefits,  which include, but are not limited to, bleeding, infection, perforation,respiratory complication & drug reaction.  The patient agrees with this plan & written consent will be obtained.     F/u in 8 weeks Dr Jonathon Bellows  Gastroenterology/Hepatology Pager: [phone]

## 2016-02-14 ENCOUNTER — Other Ambulatory Visit: Payer: Self-pay

## 2016-02-22 ENCOUNTER — Other Ambulatory Visit: Payer: Self-pay

## 2016-03-05 ENCOUNTER — Encounter: Payer: Self-pay | Admitting: *Deleted

## 2016-03-06 ENCOUNTER — Encounter: Payer: Self-pay | Admitting: Anesthesiology

## 2016-03-06 ENCOUNTER — Ambulatory Visit
Admission: RE | Admit: 2016-03-06 | Payer: BLUE CROSS/BLUE SHIELD | Source: Ambulatory Visit | Admitting: Gastroenterology

## 2016-03-06 ENCOUNTER — Encounter: Admission: RE | Payer: Self-pay | Source: Ambulatory Visit

## 2016-03-06 SURGERY — ESOPHAGOGASTRODUODENOSCOPY (EGD) WITH PROPOFOL
Anesthesia: General

## 2016-04-02 ENCOUNTER — Other Ambulatory Visit: Payer: Self-pay

## 2017-05-09 ENCOUNTER — Ambulatory Visit
Admission: EM | Admit: 2017-05-09 | Discharge: 2017-05-09 | Disposition: A | Discharge status: Home / Self Care | Payer: BLUE CROSS/BLUE SHIELD | Attending: Family Medicine | Admitting: Family Medicine

## 2017-05-09 ENCOUNTER — Other Ambulatory Visit: Payer: Self-pay

## 2017-05-09 ENCOUNTER — Encounter: Payer: Self-pay | Admitting: Emergency Medicine

## 2017-05-09 DIAGNOSIS — R05 Cough: Secondary | ICD-10-CM | POA: Diagnosis not present

## 2017-05-09 DIAGNOSIS — J029 Acute pharyngitis, unspecified: Secondary | ICD-10-CM | POA: Diagnosis not present

## 2017-05-09 DIAGNOSIS — R059 Cough, unspecified: Secondary | ICD-10-CM

## 2017-05-09 LAB — RAPID STREP SCREEN (MED CTR MEBANE ONLY): STREPTOCOCCUS, GROUP A SCREEN (DIRECT): NEGATIVE

## 2017-05-09 MED ORDER — AMOXICILLIN 875 MG PO TABS: 875.0000 mg | ORAL_TABLET | Freq: Two times a day (BID) | ORAL | 0 refills | Status: DC

## 2017-05-09 NOTE — ED Provider Notes (Signed)
[facility] ____________________________________________  Time seen: Approximately 1230 PM  I have reviewed the triage vital signs and the nursing notes.   HISTORY  Chief Complaint Sore Throat  HPI Suzanne Stout is a 42 y.o. female presenting for evaluation of sore throat as well as cough.  States cough is been present for 2 to 3 days, sore throat present for the last 2 days.  States initially started out with cough and reports had some soreness from coughing forcefully, denies current chest pain or shortness of breath.  States cough is overall nonproductive.  Reports since yesterday afternoon sore throat that has quickly worsened.  States sore throat is currently moderate and hurts to swallow.  No accompanying nasal congestion. States her step daughter who she has been around the last several days, tested positive for strep today.  Denies known fevers.  Has not been taking over-the-counter medications for the same complaints.  Overall continues to drink plenty of fluids, decreased appetite.  Denies other aggravating or alleviating factors.  Reports otherwise feels well. Denies chest pain, shortness of breath, abdominal pain, dysuria, or rash. Denies recent sickness. Denies recent antibiotic use.   Tyrone Nine, MD: PCP No LMP recorded. (Menstrual status: IUD).Denies pregnancy   Past Medical History:  Diagnosis Date  . Hypertension   . Psoriasis   . RLS (restless legs syndrome)     Patient Active Problem List   Diagnosis Date Noted  . Abdominal pain 01/17/2016  . Gastric distention 01/17/2016  . HTN (hypertension) 01/17/2016  . RLS (restless legs syndrome) 01/17/2016    Past Surgical History:  Procedure Laterality Date  . BREAST LUMPECTOMY    . ESOPHAGOGASTRODUODENOSCOPY (EGD) WITH PROPOFOL N/A 01/19/2016   Procedure: ESOPHAGOGASTRODUODENOSCOPY (EGD) WITH PROPOFOL;  Surgeon: Jonathon Bellows, MD;  Location: [facility] ENDOSCOPY;  Service: Endoscopy;  Laterality: N/A;      No current facility-administered medications for this encounter.   Current Outpatient Medications:  .  lisinopril-hydrochlorothiazide (PRINZIDE,ZESTORETIC) 20-25 MG tablet, Take 1 tablet by mouth daily., Disp: , Rfl:  .  omeprazole (PRILOSEC) 40 MG capsule, Take 1 capsule (40 mg total) by mouth daily., Disp: 30 capsule, Rfl: 2 .  rOPINIRole (REQUIP) 0.25 MG tablet, Take 0.25 mg by mouth at bedtime., Disp: , Rfl:  .  zolpidem (AMBIEN) 10 MG tablet, Take 10 mg by mouth at bedtime., Disp: , Rfl:  .  amoxicillin (AMOXIL) 875 MG tablet, Take 1 tablet (875 mg total) by mouth 2 (two) times daily., Disp: 20 tablet, Rfl: 0  Allergies Latex  Family History  Problem Relation Age of Onset  . Gallbladder disease Other     Social History Social History   Tobacco Use  . Smoking status: Never Smoker  . Smokeless tobacco: Never Used  Substance Use Topics  . Alcohol use: No  . Drug use: Not on file    Review of Systems Constitutional: No fever/chills ENT: As above. Cardiovascular: Denies chest pain. Respiratory: Denies shortness of breath. Gastrointestinal: No abdominal pain.   Musculoskeletal: Negative for back pain. Skin: Negative for rash.   ____________________________________________   PHYSICAL EXAM:  VITAL SIGNS: ED Triage Vitals  Enc Vitals Group     BP 05/09/17 1124 105/63     Pulse Rate 05/09/17 1124 79     Resp 05/09/17 1124 18     Temp 05/09/17 1124 98.8 F (37.1 C)     Temp src --      SpO2 05/09/17 1124 100 %     Weight 05/09/17 1121  170 lb (77.1 kg)     Height 05/09/17 1121 5\' 5"  (1.651 m)     Head Circumference --      Peak Flow --      Pain Score 05/09/17 1120 2     Pain Loc --      Pain Edu? --      Excl. in [place]? --    Constitutional: Alert and oriented. Well appearing and in no acute distress. Eyes: Conjunctivae are normal. Head: Atraumatic. No sinus tenderness to palpation. No swelling. No erythema.  Ears: no erythema, normal TMs bilaterally.    Nose:No nasal congestion   Mouth/Throat: Mucous membranes are moist. Moderate pharyngeal erythema. No tonsillar swelling or exudate.  Neck: No stridor.  No cervical spine tenderness to palpation. Hematological/Lymphatic/Immunilogical: anterior bilateral cervical lymphadenopathy. Cardiovascular: Normal rate, regular rhythm. Grossly normal heart sounds.  Good peripheral circulation. Respiratory: Normal respiratory effort.  No retractions. No wheezes, rales or rhonchi. Good air movement.  Musculoskeletal: Ambulatory with steady gait.  Neurologic:  Normal speech and language. No gait instability. Skin:  Skin appears warm, dry and intact. No rash noted. Psychiatric: Mood and affect are normal. Speech and behavior are normal.  ___________________________________________   LABS (all labs ordered are listed, but only abnormal results are displayed)  Labs Reviewed  RAPID STREP SCREEN ([facility] ONLY)  CULTURE, GROUP A STREP [facility])   _  PROCEDURES Procedures   INITIAL IMPRESSION / ASSESSMENT AND PLAN / ED COURSE  Pertinent labs & imaging results that were available during my care of the patient were reviewed by me and considered in my medical decision making (see chart for details).  We will bring patient.  No acute distress.  Suspect viral illness causing cough and possible pharyngitis.  However  moderate pharyngeal erythema with direct strep contact in the last several days, concern for strep.  Will empirically start oral amoxicillin and await strep culture.  Discussed culture will return in 2 to 3 days.  Encourage rest, fluids, supportive care, over-the-counter cough medication as needed.Discussed indication, risks and benefits of medications with patient.  Discussed follow up with Primary care physician this week. Discussed follow up and return parameters including no resolution or any worsening concerns. Patient verbalized understanding and agreed to plan.    ____________________________________________   FINAL CLINICAL IMPRESSION(S) / ED DIAGNOSES  Final diagnoses:  Pharyngitis, unspecified etiology  Cough     ED Discharge Orders        Ordered    amoxicillin (AMOXIL) 875 MG tablet  2 times daily     05/09/17 1212       Note: This dictation was prepared with Dragon dictation along with smaller phrase technology. Any transcriptional errors that result from this process are unintentional.         Marylene Land, NP 05/09/17 1317

## 2017-05-09 NOTE — Discharge Instructions (Addendum)
Take medication as prescribed. Rest. Drink plenty of fluids.  ° °Follow up with your primary care physician this week as needed. Return to Urgent care for new or worsening concerns.  ° °

## 2017-05-09 NOTE — ED Triage Notes (Signed)
Patient states she is aching all over and has a bad sore throat.  Family member just diagnosed with strep

## 2017-05-12 LAB — CULTURE, GROUP A STREP (THRC)

## 2019-01-30 ENCOUNTER — Ambulatory Visit: Payer: BLUE CROSS/BLUE SHIELD | Attending: Internal Medicine

## 2019-01-30 DIAGNOSIS — Z20822 Contact with and (suspected) exposure to covid-19: Secondary | ICD-10-CM

## 2019-01-31 LAB — NOVEL CORONAVIRUS, NAA: SARS-CoV-2, NAA: NOT DETECTED

## 2020-01-10 ENCOUNTER — Other Ambulatory Visit: Payer: Self-pay

## 2020-01-10 ENCOUNTER — Ambulatory Visit (INDEPENDENT_AMBULATORY_CARE_PROVIDER_SITE_OTHER): Payer: BC Managed Care – PPO

## 2020-01-10 ENCOUNTER — Encounter: Payer: Self-pay | Admitting: Emergency Medicine

## 2020-01-10 ENCOUNTER — Ambulatory Visit
Admission: EM | Admit: 2020-01-10 | Discharge: 2020-01-10 | Disposition: A | Discharge status: Home / Self Care | Payer: BC Managed Care – PPO

## 2020-01-10 ENCOUNTER — Ambulatory Visit: Admit: 2020-01-10 | Discharge status: Absent without leave | Payer: Self-pay | Source: Home / Self Care

## 2020-01-10 DIAGNOSIS — M79641 Pain in right hand: Secondary | ICD-10-CM

## 2020-01-10 DIAGNOSIS — M25521 Pain in right elbow: Secondary | ICD-10-CM | POA: Diagnosis not present
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
## 2020-01-10 NOTE — Discharge Instructions (Addendum)
The x-rays of her elbow and hands are normal.  You have some inflammation at the elbow and as discussed inflammation around the ulnar nerve can cause some radiating pain down to the fourth and fifth digits as well as some occasional numbness or tingling.  Continue to ice her elbow and elevate it.  He can continue to take Tylenol and Aleve for symptoms.  You should get better within the next several days.  If he did not, he should be seen again for repeat x-rays.

## 2020-01-11 ENCOUNTER — Ambulatory Visit: Payer: Self-pay

## 2020-01-27 ENCOUNTER — Other Ambulatory Visit: Payer: BC Managed Care – PPO

## 2020-03-15 ENCOUNTER — Encounter: Payer: Self-pay | Admitting: Obstetrics and Gynecology

## 2020-03-15 ENCOUNTER — Other Ambulatory Visit: Payer: Self-pay

## 2020-03-15 ENCOUNTER — Other Ambulatory Visit (HOSPITAL_COMMUNITY)
Admission: RE | Admit: 2020-03-15 | Discharge: 2020-03-15 | Disposition: A | Discharge status: Home / Self Care | Payer: BC Managed Care – PPO | Source: Ambulatory Visit | Attending: Obstetrics and Gynecology | Admitting: Obstetrics and Gynecology

## 2020-03-15 ENCOUNTER — Ambulatory Visit (INDEPENDENT_AMBULATORY_CARE_PROVIDER_SITE_OTHER): Payer: BC Managed Care – PPO | Admitting: Obstetrics and Gynecology

## 2020-03-15 VITALS — BP 112/68 | HR 112 | Ht 64.5 in | Wt 174.1 lb

## 2020-03-15 DIAGNOSIS — Z30011 Encounter for initial prescription of contraceptive pills: Secondary | ICD-10-CM | POA: Diagnosis not present

## 2020-03-15 DIAGNOSIS — Z124 Encounter for screening for malignant neoplasm of cervix: Secondary | ICD-10-CM | POA: Insufficient documentation

## 2020-03-15 DIAGNOSIS — Z308 Encounter for other contraceptive management: Secondary | ICD-10-CM

## 2020-03-15 DIAGNOSIS — N939 Abnormal uterine and vaginal bleeding, unspecified: Secondary | ICD-10-CM

## 2020-03-15 DIAGNOSIS — Z1231 Encounter for screening mammogram for malignant neoplasm of breast: Secondary | ICD-10-CM | POA: Diagnosis not present

## 2020-03-15 DIAGNOSIS — Z30432 Encounter for removal of intrauterine contraceptive device: Secondary | ICD-10-CM | POA: Diagnosis not present

## 2020-03-15 DIAGNOSIS — Z3009 Encounter for other general counseling and advice on contraception: Secondary | ICD-10-CM

## 2020-03-15 MED ORDER — SLYND 4 MG PO TABS: 1.0000 | ORAL_TABLET | Freq: Every day | ORAL | 3 refills | Status: DC

## 2020-03-15 NOTE — Patient Instructions (Signed)
[facility] [address] [place] [address]  [facility] [place]  [address] [place] [address]  Phone: [phone]  

## 2020-03-15 NOTE — Progress Notes (Signed)
Obstetrics & Gynecology Office Visit   Chief Complaint:  Chief Complaint  Patient presents with  . Gynecologic Exam    Annual - IUD removal, discuss options for OCP. RM 5    History of Present Illness: Patient is a 45 y.o. No obstetric history on file. presenting for contraception consult.  She is currently on IUD and desiring to start tubal ligation.  She has a past medical history significant for no contraindication to estrogen.  She specifically denies a history of migraine with aura, chronic hypertension, history of DVT/PE and smoking.  Reported No LMP recorded. (Menstrual status: IUD)..    Review of Systems: Review of Systems  Constitutional: Negative.   Gastrointestinal: Negative.   Genitourinary: Negative.      Past Medical History:  Past Medical History:  Diagnosis Date  . Hypertension   . Psoriasis   . RLS (restless legs syndrome)     Past Surgical History:  Past Surgical History:  Procedure Laterality Date  . BREAST LUMPECTOMY    . ESOPHAGOGASTRODUODENOSCOPY (EGD) WITH PROPOFOL N/A 01/19/2016   Procedure: ESOPHAGOGASTRODUODENOSCOPY (EGD) WITH PROPOFOL;  Surgeon: Jonathon Bellows, MD;  Location: [facility] ENDOSCOPY;  Service: Endoscopy;  Laterality: N/A;    Gynecologic History: No LMP recorded. (Menstrual status: IUD).  Obstetric History: No obstetric history on file.  Family History:  Family History  Problem Relation Age of Onset  . Gallbladder disease Other   . Hypertension Mother   . Alcoholism Father     Social History:  Social History   Socioeconomic History  . Marital status: Married    Spouse name: Not on file  . Number of children: Not on file  . Years of education: Not on file  . Highest education level: Not on file  Occupational History  . Not on file  Tobacco Use  . Smoking status: Never Smoker  . Smokeless tobacco: Never Used  Vaping Use  . Vaping Use: Never used  Substance and Sexual Activity  . Alcohol use: No  . Drug use: Never  .  Sexual activity: Yes    Birth control/protection: None  Other Topics Concern  . Not on file  Social History Narrative  . Not on file   Social Determinants of Health   Financial Resource Strain: Not on file  Food Insecurity: Not on file  Transportation Needs: Not on file  Physical Activity: Not on file  Stress: Not on file  Social Connections: Not on file  Intimate Partner Violence: Not on file    Allergies:  Allergies  Allergen Reactions  . Latex Rash    Medications: Prior to Admission medications   Medication Sig Start Date End Date Taking? Authorizing Provider  amoxicillin (AMOXIL) 875 MG tablet Take 1 tablet (875 mg total) by mouth 2 (two) times daily. 05/09/17   Marylene Land, NP  levonorgestrel (MIRENA) 20 MCG/24HR IUD 1 each by Intrauterine route once.    [provider]  lisinopril-hydrochlorothiazide (PRINZIDE,ZESTORETIC) 20-25 MG tablet Take 1 tablet by mouth daily.    [provider]  omeprazole (PRILOSEC) 40 MG capsule Take 1 capsule (40 mg total) by mouth daily. 01/19/16   Dustin Flock, MD  rOPINIRole (REQUIP) 0.25 MG tablet Take 0.25 mg by mouth at bedtime.    [provider]  zolpidem (AMBIEN) 10 MG tablet Take 10 mg by mouth at bedtime.    [provider]    Physical Exam Vitals:  Vitals:   03/15/20 1331  BP: 112/68  Pulse: (!) 112  No LMP recorded. (Menstrual status: IUD).  General: NAD HEENT: normocephalic, anicteric Pulmonary: No increased work of breathing Abdomen: soft, non-tender, non-distended.  Umbilicus without lesions.  No hepatomegaly, splenomegaly or masses palpable. No evidence of hernia  Genitourinary:  External: Normal external female genitalia.  Normal urethral meatus, normal  Bartholin's and Skene's glands.    Vagina: Normal vaginal mucosa, no evidence of prolapse.    Cervix: Grossly normal in appearance, no bleeding. IUD strings visualized  Uterus: Non-enlarged, mobile, normal contour.  No  CMT  Adnexa: ovaries non-enlarged, no adnexal masses  Rectal: deferred  Lymphatic: no evidence of inguinal lymphadenopathy Extremities: no edema, erythema, or tenderness Neurologic: Grossly intact Psychiatric: mood appropriate, affect full  Female chaperone present for pelvic  portions of the physical exam   GYNECOLOGY OFFICE PROCEDURE NOTE  RAYNISHA AVILLA is a 45 y.o. No obstetric history on file. here for Mirena IUD removal placed 7 years ago. She desires removal secondary to expiration of IUD  IUD Removal  Patient identified, informed consent performed, consent signed.  Patient was in the dorsal lithotomy position, normal external genitalia was noted.  A speculum was placed in the patient's vagina, normal discharge was noted, no lesions. The cervix was visualized, no lesions, no abnormal discharge.  The strings of the IUD were grasped and pulled using ring forceps. The IUD was removed in its entirety. Patient tolerated the procedure well.    Assessment: 45 y.o. annual and IUD removal, tubal ligation counseling  Plan: Problem List Items Addressed This Visit   None   Visit Diagnoses    Screening for malignant neoplasm of cervix    -  Primary   Relevant Orders   Cytology - PAP (Completed)   Encounter for IUD removal       Relevant Orders   Cytology - PAP (Completed)   Initiation of oral contraception       Breast cancer screening by mammogram       Relevant Orders   MM 3D SCREEN BREAST BILATERAL     1)} IUD - IUD removed without difficulty today.  We discussed contraceptive options and the patient is not interested in future pregnancy and expressed interest in tubal ligation.  45 y.o. G1P1001  with undesired fertility, desires permanent sterilization.  Other reversible forms of contraception were discussed with patient; she declines all other modalities. Permanent nature of as well as associated risks of the procedure discussed with patient including but not limited to: risk  of regret, permanence of method, bleeding, infection, injury to surrounding organs and need for additional procedures.  Failure risk of 0.5-1% with increased risk of ectopic gestation if pregnancy occurs was also discussed with patient.   - Post for laproscopic salpingectomy, IUD insertion AUB - does desire replacement of IUD for cycle control.  She report significant discomfort with last IUD insertion so we will to this concurrently at the time of BTL  2) A total of 25 minutes were spent in face-to-face contact with the patient during this encounter with over half of that time devoted to counseling and coordination of care.  3) Healthcare maintenance - pap obtained - mammogram ordred  4) Return in about 1 year (around 03/15/2021) for annual.   Malachy Mood, MD, [place] 03/15/2020, 1:32 PM

## 2020-03-18 LAB — CYTOLOGY - PAP
Comment: NEGATIVE
Diagnosis: NEGATIVE
High risk HPV: NEGATIVE

## 2020-03-25 ENCOUNTER — Telehealth: Payer: Self-pay

## 2020-03-25 NOTE — Telephone Encounter (Signed)
Left a message for the patient to return the call.  

## 2020-03-25 NOTE — Telephone Encounter (Signed)
-----   Message from Malachy Mood, MD sent at 03/18/2020 10:44 PM EST ----- Regarding: Surgery Surgery Booking Request Patient Full Name:  Suzanne Stout  MRN: [id number]  DOB: 1975/08/27  Surgeon: Malachy Mood, MD  Requested Surgery Date and Time: 2 weeks Primary Diagnosis AND Code: Unwanted fertility Z30.09 Secondary Diagnosis and Code:  Surgical Procedure: laparoscopic bilateral salpingectomy RNFA Requested?: No L&D Notification: No Admission Status: same day surgery Length of Surgery: 50 min Special Case Needs: No H&P: Yes Phone Interview???:  Yes Interpreter: No Medical Clearance:  No Special Scheduling Instructions: No Any known health/anesthesia issues, diabetes, sleep apnea, latex allergy, defibrillator/pacemaker?: No Acuity: P3   (P1 highest, P2 delay may cause harm, P3 low, elective gyn, P4 lowest)

## 2020-04-01 NOTE — Telephone Encounter (Signed)
I called pt on 3/11 and l/m for her to rtn the call. I will call her today. Thanks.

## 2020-04-01 NOTE — Telephone Encounter (Signed)
Left a message for the patient to return the call.  

## 2020-04-01 NOTE — Telephone Encounter (Signed)
Patient returned call to schedule laparoscopic bilateral salpingectomy w Georgianne Fick  DOS 3/29  H&P 3/21 @ 11:30   Covid testing 3/25 @ [facility], ste [address]. Advised pt to quarantine until DOS.  Pre-admit phone call appointment to be requested - date and time will be included on H&P paper work. Also all appointments will be updated on pt MyChart. Explained that this appointment has a call window. Based on the time scheduled will indicate if the call will be received within a 4 hour window before 1:00 or after.  Advised that pt may also receive calls from the hospital pharmacy and pre-service center.  Confirmed pt has [employer] as Chartered certified accountant. No secondary insurance.

## 2020-04-04 ENCOUNTER — Encounter: Payer: Self-pay | Admitting: Obstetrics and Gynecology

## 2020-04-04 ENCOUNTER — Ambulatory Visit (INDEPENDENT_AMBULATORY_CARE_PROVIDER_SITE_OTHER): Payer: BC Managed Care – PPO | Admitting: Obstetrics and Gynecology

## 2020-04-04 ENCOUNTER — Other Ambulatory Visit: Payer: Self-pay

## 2020-04-04 VITALS — BP 118/72 | Wt 173.0 lb

## 2020-04-04 DIAGNOSIS — Z308 Encounter for other contraceptive management: Secondary | ICD-10-CM | POA: Diagnosis not present

## 2020-04-04 DIAGNOSIS — Z01818 Encounter for other preprocedural examination: Secondary | ICD-10-CM | POA: Diagnosis not present

## 2020-04-04 DIAGNOSIS — Z3009 Encounter for other general counseling and advice on contraception: Secondary | ICD-10-CM

## 2020-04-04 NOTE — Progress Notes (Signed)
Obstetrics & Gynecology Surgery H&P    Chief Complaint: Scheduled Surgery   History of Present Illness: Patient is a 45 y.o. G1P1001 presenting for scheduled laparoscopic bilateral salpingectomy, for the treatment or further evaluation of undesired fertility.   Prior Treatments prior to proceeding with surgery include: discussion of alternative contraceptive options  Preoperative Pap: 03/15/2020 NILM  Preoperative Endometrial biopsy: N/A Preoperative Ultrasound: N/A  Review of Systems:10 point review of systems  Past Medical History:  Patient Active Problem List   Diagnosis Date Noted  . Abdominal pain 01/17/2016  . Gastric distention 01/17/2016  . HTN (hypertension) 01/17/2016  . RLS (restless legs syndrome) 01/17/2016    Past Surgical History:  Past Surgical History:  Procedure Laterality Date  . BREAST LUMPECTOMY    . ESOPHAGOGASTRODUODENOSCOPY (EGD) WITH PROPOFOL N/A 01/19/2016   Procedure: ESOPHAGOGASTRODUODENOSCOPY (EGD) WITH PROPOFOL;  Surgeon: Jonathon Bellows, MD;  Location: [facility] ENDOSCOPY;  Service: Endoscopy;  Laterality: N/A;    Family History:  Family History  Problem Relation Age of Onset  . Gallbladder disease Other   . Hypertension Mother   . Alcoholism Father     Social History:  Social History   Socioeconomic History  . Marital status: Married    Spouse name: Not on file  . Number of children: Not on file  . Years of education: Not on file  . Highest education level: Not on file  Occupational History  . Not on file  Tobacco Use  . Smoking status: Never Smoker  . Smokeless tobacco: Never Used  Vaping Use  . Vaping Use: Never used  Substance and Sexual Activity  . Alcohol use: No  . Drug use: Never  . Sexual activity: Yes    Birth control/protection: None  Other Topics Concern  . Not on file  Social History Narrative  . Not on file   Social Determinants of Health   Financial Resource Strain: Not on file  Food Insecurity: Not on file   Transportation Needs: Not on file  Physical Activity: Not on file  Stress: Not on file  Social Connections: Not on file  Intimate Partner Violence: Not on file    Allergies:  Allergies  Allergen Reactions  . Latex Dermatitis and Rash    Medications: Prior to Admission medications   Medication Sig Start Date End Date Taking? Authorizing Provider  aspirin-acetaminophen-caffeine (EXCEDRIN MIGRAINE) [phone] MG tablet Take 2 tablets by mouth every 6 (six) hours as needed for headache.    [provider]  Drospirenone (SLYND) 4 MG TABS Take 1 tablet by mouth daily. Patient taking differently: Take 4 mg by mouth daily. 03/15/20   Malachy Mood, MD  lisinopril-hydrochlorothiazide (ZESTORETIC) 10-12.5 MG tablet Take 1 tablet by mouth daily.    [provider]  omeprazole (PRILOSEC OTC) 20 MG tablet Take 20 mg by mouth daily as needed (acid reflux).    [provider]  rOPINIRole (REQUIP) 0.25 MG tablet Take 0.25 mg by mouth at bedtime.    [provider]    Physical Exam Vitals: Blood pressure 118/72, weight 173 lb (78.5 kg).  General: NAD HEENT: normocephalic, anicteric Pulmonary: No increased work of breathing, CTAB Cardiovascular: RRR, distal pulses 2+ Abdomen: soft, non-tender, non-distended Genitourinary: deferred Extremities: no edema, erythema, or tenderness Neurologic: Grossly intact Psychiatric: mood appropriate, affect full  Immunization History  Administered Date(s) Administered  . Tdap 04/07/2015     Imaging No results found.  Assessment: 45 y.o. G1P1001 presenting for scheduled laparoscopic bilateral salpingectomy  Plan: 1)  45 y.o. G1P1001  with undesired fertility, desires permanent sterilization.  Other reversible forms of contraception were discussed with patient; she declines all other modalities. Permanent nature of as well as associated risks of the procedure discussed with patient including but not limited to: risk  of regret, permanence of method, bleeding, infection, injury to surrounding organs and need for additional procedures.  Failure risk of 0.5-1% with increased risk of ectopic gestation if pregnancy occurs was also discussed with patient.    2) Routine postoperative instructions were reviewed with the patient and her family in detail today including the expected length of recovery and likely postoperative course.  The patient concurred with the proposed plan, giving informed written consent for the surgery today.  Patient instructed on the importance of being NPO after midnight prior to her procedure.  If warranted preoperative prophylactic antibiotics and SCDs ordered on call to the OR to meet SCIP guidelines and adhere to recommendation laid forth in [place] Number 104 May 2009  "Antibiotic Prophylaxis for Gynecologic Procedures".     Malachy Mood, MD, [facility], [facility] 04/04/2020, 11:28 AM

## 2020-04-04 NOTE — H&P (View-Only) (Signed)
Obstetrics & Gynecology Surgery H&P    Chief Complaint: Scheduled Surgery   History of Present Illness: Patient is a 45 y.o. G1P1001 presenting for scheduled laparoscopic bilateral salpingectomy, for the treatment or further evaluation of undesired fertility.   Prior Treatments prior to proceeding with surgery include: discussion of alternative contraceptive options  Preoperative Pap: 03/15/2020 NILM  Preoperative Endometrial biopsy: N/A Preoperative Ultrasound: N/A  Review of Systems:10 point review of systems  Past Medical History:  Patient Active Problem List   Diagnosis Date Noted  . Abdominal pain 01/17/2016  . Gastric distention 01/17/2016  . HTN (hypertension) 01/17/2016  . RLS (restless legs syndrome) 01/17/2016    Past Surgical History:  Past Surgical History:  Procedure Laterality Date  . BREAST LUMPECTOMY    . ESOPHAGOGASTRODUODENOSCOPY (EGD) WITH PROPOFOL N/A 01/19/2016   Procedure: ESOPHAGOGASTRODUODENOSCOPY (EGD) WITH PROPOFOL;  Surgeon: Jonathon Bellows, MD;  Location: [facility] ENDOSCOPY;  Service: Endoscopy;  Laterality: N/A;    Family History:  Family History  Problem Relation Age of Onset  . Gallbladder disease Other   . Hypertension Mother   . Alcoholism Father     Social History:  Social History   Socioeconomic History  . Marital status: Married    Spouse name: Not on file  . Number of children: Not on file  . Years of education: Not on file  . Highest education level: Not on file  Occupational History  . Not on file  Tobacco Use  . Smoking status: Never Smoker  . Smokeless tobacco: Never Used  Vaping Use  . Vaping Use: Never used  Substance and Sexual Activity  . Alcohol use: No  . Drug use: Never  . Sexual activity: Yes    Birth control/protection: None  Other Topics Concern  . Not on file  Social History Narrative  . Not on file   Social Determinants of Health   Financial Resource Strain: Not on file  Food Insecurity: Not on file   Transportation Needs: Not on file  Physical Activity: Not on file  Stress: Not on file  Social Connections: Not on file  Intimate Partner Violence: Not on file    Allergies:  Allergies  Allergen Reactions  . Latex Dermatitis and Rash    Medications: Prior to Admission medications   Medication Sig Start Date End Date Taking? Authorizing Provider  aspirin-acetaminophen-caffeine (EXCEDRIN MIGRAINE) [phone] MG tablet Take 2 tablets by mouth every 6 (six) hours as needed for headache.    [provider]  Drospirenone (SLYND) 4 MG TABS Take 1 tablet by mouth daily. Patient taking differently: Take 4 mg by mouth daily. 03/15/20   Malachy Mood, MD  lisinopril-hydrochlorothiazide (ZESTORETIC) 10-12.5 MG tablet Take 1 tablet by mouth daily.    [provider]  omeprazole (PRILOSEC OTC) 20 MG tablet Take 20 mg by mouth daily as needed (acid reflux).    [provider]  rOPINIRole (REQUIP) 0.25 MG tablet Take 0.25 mg by mouth at bedtime.    [provider]    Physical Exam Vitals: Blood pressure 118/72, weight 173 lb (78.5 kg).  General: NAD HEENT: normocephalic, anicteric Pulmonary: No increased work of breathing, CTAB Cardiovascular: RRR, distal pulses 2+ Abdomen: soft, non-tender, non-distended Genitourinary: deferred Extremities: no edema, erythema, or tenderness Neurologic: Grossly intact Psychiatric: mood appropriate, affect full  Immunization History  Administered Date(s) Administered  . Tdap 04/07/2015     Imaging No results found.  Assessment: 46 y.o. G1P1001 presenting for scheduled laparoscopic bilateral salpingectomy  Plan: 1)  45 y.o. G1P1001  with undesired fertility, desires permanent sterilization.  Other reversible forms of contraception were discussed with patient; she declines all other modalities. Permanent nature of as well as associated risks of the procedure discussed with patient including but not limited to: risk  of regret, permanence of method, bleeding, infection, injury to surrounding organs and need for additional procedures.  Failure risk of 0.5-1% with increased risk of ectopic gestation if pregnancy occurs was also discussed with patient.    2) Routine postoperative instructions were reviewed with the patient and her family in detail today including the expected length of recovery and likely postoperative course.  The patient concurred with the proposed plan, giving informed written consent for the surgery today.  Patient instructed on the importance of being NPO after midnight prior to her procedure.  If warranted preoperative prophylactic antibiotics and SCDs ordered on call to the OR to meet SCIP guidelines and adhere to recommendation laid forth in [place] Number 104 May 2009  "Antibiotic Prophylaxis for Gynecologic Procedures".     Malachy Mood, MD, [facility], [facility] 04/04/2020, 11:28 AM

## 2020-04-06 ENCOUNTER — Other Ambulatory Visit
Admission: RE | Admit: 2020-04-06 | Discharge: 2020-04-06 | Disposition: A | Discharge status: Home / Self Care | Payer: BC Managed Care – PPO | Source: Ambulatory Visit | Attending: Obstetrics and Gynecology | Admitting: Obstetrics and Gynecology

## 2020-04-06 ENCOUNTER — Other Ambulatory Visit: Payer: Self-pay

## 2020-04-06 HISTORY — DX: Anxiety disorder, unspecified: F41.9

## 2020-04-06 NOTE — Patient Instructions (Signed)
Your procedure is scheduled on: Tuesday April 12, 2020. Report to Day Surgery inside [facility] 2nd floor (stop by Admissions desk first before getting on the Elevator). To find out your arrival time please call [phone] between 1PM - 3PM on Monday April 11, 2020.  Remember: Instructions that are not followed completely may result in serious medical risk,  up to and including death, or upon the discretion of your surgeon and anesthesiologist your  surgery may need to be rescheduled.     _X__ 1. Do not eat food after midnight the night before your procedure.                 No chewing gum or hard candies. You may drink clear liquids up to 2 hours                 before you are scheduled to arrive for your surgery- DO not drink clear                 liquids within 2 hours of the start of your surgery.                 Clear Liquids include:  water, apple juice without pulp, clear Gatorade, G2 or                  Gatorade Zero (avoid Red/Purple/Blue), Black Coffee or Tea (Do not add                 anything to coffee or tea).  __X__2.  On the morning of surgery brush your teeth with toothpaste and water, you                may rinse your mouth with mouthwash if you wish.  Do not swallow any toothpaste of mouthwash.     _X__ 3.  No Alcohol for 24 hours before or after surgery.   _X__ 4.  Do Not Smoke or use e-cigarettes For 24 Hours Prior to Your Surgery.                 Do not use any chewable tobacco products for at least 6 hours prior to                 Surgery.  _X__  5.  Do not use any recreational drugs (marijuana, cocaine, heroin, ecstasy, MDMA or other)                For at least one week prior to your surgery.  Combination of these drugs with anesthesia                May have life threatening results.  __X__ 6.  Notify your doctor if there is any change in your medical condition      (cold, fever, infections).     Do not wear jewelry, make-up,  hairpins, clips or nail polish. Do not wear lotions, powders, or perfumes. You may wear deodorant. Do not shave 48 hours prior to surgery. Do not bring valuables to the hospital.    [facility] is not responsible for any belongings or valuables.  Contacts, dentures or bridgework may not be worn into surgery. Leave your suitcase in the car. After surgery it may be brought to your room. For patients admitted to the hospital, discharge time is determined by your treatment team.   Patients discharged the day of surgery will not be allowed to drive home.   Make arrangements  for someone to be with you for the first 24 hours of your Same Day Discharge.   __X__ Take these medicines the morning of surgery with A SIP OF WATER:    1. omeprazole (PRILOSEC OTC) 20 mg   2.   3.   4.  5.  6.  ____ Fleet Enema (as directed)   __X__ Use CHG Soap (or wipes) as directed  ____ Use Benzoyl Peroxide Gel as instructed  ____ Use inhalers on the day of surgery  ____ Stop metformin 2 days prior to surgery    ____ Take 1/2 of usual insulin dose the night before surgery. No insulin the morning          of surgery.   __X__ Stop Anti-inflammatories such as Ibuprofen, Aleve, Advil, naproxen, (EXCEDRIN MIGRAINE), aspirin and or BC powders.     __X__ Stop supplements until after surgery.    __X__ Do not start any herbal supplements before your procedure.    If you have any questions regarding your pre-procedure instructions,  Please call Pre-admit Testing at [phone].

## 2020-04-08 ENCOUNTER — Other Ambulatory Visit: Payer: Self-pay

## 2020-04-08 ENCOUNTER — Other Ambulatory Visit
Admission: RE | Admit: 2020-04-08 | Discharge: 2020-04-08 | Disposition: A | Discharge status: Home / Self Care | Payer: BC Managed Care – PPO | Source: Ambulatory Visit | Attending: Obstetrics and Gynecology | Admitting: Obstetrics and Gynecology

## 2020-04-08 ENCOUNTER — Other Ambulatory Visit: Payer: BC Managed Care – PPO

## 2020-04-08 DIAGNOSIS — Z20822 Contact with and (suspected) exposure to covid-19: Secondary | ICD-10-CM | POA: Diagnosis not present

## 2020-04-08 DIAGNOSIS — Z01818 Encounter for other preprocedural examination: Secondary | ICD-10-CM | POA: Insufficient documentation

## 2020-04-08 LAB — BASIC METABOLIC PANEL
Anion gap: 6 (ref 5–15)
BUN: 25 mg/dL — ABNORMAL HIGH (ref 6–20)
CO2: 23 mmol/L (ref 22–32)
Calcium: 9.5 mg/dL (ref 8.9–10.3)
Chloride: 110 mmol/L (ref 98–111)
Creatinine, Ser: 1.08 mg/dL — ABNORMAL HIGH (ref 0.44–1.00)
GFR, Estimated: >60 mL/min (ref >60)
Glucose, Bld: 93 mg/dL (ref 70–99)
Potassium: 3.7 mmol/L (ref 3.5–5.1)
Sodium: 139 mmol/L (ref 135–145)

## 2020-04-08 LAB — CBC
HCT: 34.5 % — ABNORMAL LOW (ref 36.0–46.0)
Hemoglobin: 11.7 g/dL — ABNORMAL LOW (ref 12.0–15.0)
MCH: 31.6 pg (ref 26.0–34.0)
MCHC: 33.9 g/dL (ref 30.0–36.0)
MCV: 93.2 fL (ref 80.0–100.0)
Platelets: 339 10*3/uL (ref 150–400)
RBC: 3.7 MIL/uL — ABNORMAL LOW (ref 3.87–5.11)
RDW: 12.2 % (ref 11.5–15.5)
WBC: 7.7 10*3/uL (ref 4.0–10.5)
nRBC: 0 % (ref 0.0–0.2)

## 2020-04-08 LAB — SARS CORONAVIRUS 2 (TAT 6-24 HRS): SARS Coronavirus 2: NEGATIVE

## 2020-04-11 MED ORDER — POVIDONE-IODINE 10 % EX SWAB
2.0000 "application " | Freq: Once | CUTANEOUS | Status: AC
  Administered 2020-04-12: 2 via TOPICAL

## 2020-04-11 MED ORDER — LACTATED RINGERS IV SOLN: INTRAVENOUS | Status: DC

## 2020-04-11 MED ORDER — ORAL CARE MOUTH RINSE: 15.0000 mL | Freq: Once | OROMUCOSAL | Status: AC

## 2020-04-11 MED ORDER — CHLORHEXIDINE GLUCONATE 0.12 % MT SOLN
15.0000 mL | Freq: Once | OROMUCOSAL | Status: AC
  Administered 2020-04-12: 15 mL via OROMUCOSAL

## 2020-04-12 ENCOUNTER — Other Ambulatory Visit: Payer: Self-pay

## 2020-04-12 ENCOUNTER — Encounter
Admission: RE | Disposition: A | Discharge status: Home / Self Care | Payer: Self-pay | Source: Home / Self Care | Attending: Obstetrics and Gynecology

## 2020-04-12 ENCOUNTER — Ambulatory Visit: Payer: BC Managed Care – PPO

## 2020-04-12 ENCOUNTER — Encounter: Payer: Self-pay | Admitting: Obstetrics and Gynecology

## 2020-04-12 ENCOUNTER — Ambulatory Visit
Admission: RE | Admit: 2020-04-12 | Discharge: 2020-04-12 | Disposition: A | Discharge status: Home / Self Care | Payer: BC Managed Care – PPO | Attending: Obstetrics and Gynecology | Admitting: Obstetrics and Gynecology

## 2020-04-12 DIAGNOSIS — Z79899 Other long term (current) drug therapy: Secondary | ICD-10-CM | POA: Insufficient documentation

## 2020-04-12 DIAGNOSIS — Z30430 Encounter for insertion of intrauterine contraceptive device: Secondary | ICD-10-CM

## 2020-04-12 DIAGNOSIS — I10 Essential (primary) hypertension: Secondary | ICD-10-CM | POA: Diagnosis not present

## 2020-04-12 DIAGNOSIS — Z91040 Latex allergy status: Secondary | ICD-10-CM | POA: Diagnosis not present

## 2020-04-12 DIAGNOSIS — Z7982 Long term (current) use of aspirin: Secondary | ICD-10-CM | POA: Insufficient documentation

## 2020-04-12 DIAGNOSIS — Z3009 Encounter for other general counseling and advice on contraception: Secondary | ICD-10-CM

## 2020-04-12 DIAGNOSIS — Z9889 Other specified postprocedural states: Secondary | ICD-10-CM

## 2020-04-12 DIAGNOSIS — D251 Intramural leiomyoma of uterus: Secondary | ICD-10-CM | POA: Diagnosis not present

## 2020-04-12 DIAGNOSIS — N939 Abnormal uterine and vaginal bleeding, unspecified: Secondary | ICD-10-CM

## 2020-04-12 DIAGNOSIS — Z302 Encounter for sterilization: Secondary | ICD-10-CM | POA: Diagnosis present

## 2020-04-12 DIAGNOSIS — Z8249 Family history of ischemic heart disease and other diseases of the circulatory system: Secondary | ICD-10-CM | POA: Insufficient documentation

## 2020-04-12 HISTORY — PX: LAPAROSCOPIC BILATERAL SALPINGECTOMY: SHX5889

## 2020-04-12 LAB — POCT PREGNANCY, URINE: Preg Test, Ur: NEGATIVE

## 2020-04-12 SURGERY — SALPINGECTOMY, BILATERAL, LAPAROSCOPIC
Anesthesia: General | Site: Abdomen | Laterality: Bilateral

## 2020-04-12 MED ORDER — PROPOFOL 10 MG/ML IV BOLUS
INTRAVENOUS | Status: AC
  Filled 2020-04-12: qty 20

## 2020-04-12 MED ORDER — CHLORHEXIDINE GLUCONATE 0.12 % MT SOLN
OROMUCOSAL | Status: AC
  Filled 2020-04-12: qty 15

## 2020-04-12 MED ORDER — IBUPROFEN 600 MG PO TABS: 600.0000 mg | ORAL_TABLET | Freq: Four times a day (QID) | ORAL | 0 refills | Status: DC | PRN

## 2020-04-12 MED ORDER — BUPIVACAINE HCL 0.5 % IJ SOLN
INTRAMUSCULAR | Status: DC | PRN
  Administered 2020-04-12: 10 mL

## 2020-04-12 MED ORDER — MIDAZOLAM HCL 2 MG/2ML IJ SOLN
INTRAMUSCULAR | Status: DC | PRN
  Administered 2020-04-12: 1 mg via INTRAVENOUS

## 2020-04-12 MED ORDER — ROCURONIUM BROMIDE 100 MG/10ML IV SOLN
INTRAVENOUS | Status: DC | PRN
  Administered 2020-04-12: 50 mg via INTRAVENOUS

## 2020-04-12 MED ORDER — FENTANYL CITRATE (PF) 100 MCG/2ML IJ SOLN
25.0000 ug | INTRAMUSCULAR | Status: DC | PRN
  Administered 2020-04-12 (×2): 25 ug via INTRAVENOUS

## 2020-04-12 MED ORDER — LIDOCAINE HCL (CARDIAC) PF 100 MG/5ML IV SOSY
PREFILLED_SYRINGE | INTRAVENOUS | Status: DC | PRN
  Administered 2020-04-12: 100 mg via INTRAVENOUS

## 2020-04-12 MED ORDER — OXYCODONE-ACETAMINOPHEN 5-325 MG PO TABS
ORAL_TABLET | ORAL | Status: AC
  Administered 2020-04-12: 1 via ORAL
  Filled 2020-04-12: qty 1

## 2020-04-12 MED ORDER — MIDAZOLAM HCL 2 MG/2ML IJ SOLN
INTRAMUSCULAR | Status: AC
  Filled 2020-04-12: qty 2

## 2020-04-12 MED ORDER — SUGAMMADEX SODIUM 500 MG/5ML IV SOLN
INTRAVENOUS | Status: DC | PRN
  Administered 2020-04-12: 200 mg via INTRAVENOUS

## 2020-04-12 MED ORDER — BUPIVACAINE HCL (PF) 0.5 % IJ SOLN
INTRAMUSCULAR | Status: AC
  Filled 2020-04-12: qty 30

## 2020-04-12 MED ORDER — FENTANYL CITRATE (PF) 100 MCG/2ML IJ SOLN
INTRAMUSCULAR | Status: AC
  Filled 2020-04-12: qty 2

## 2020-04-12 MED ORDER — SUGAMMADEX SODIUM 500 MG/5ML IV SOLN
INTRAVENOUS | Status: AC
  Filled 2020-04-12: qty 5

## 2020-04-12 MED ORDER — ONDANSETRON 4 MG PO TBDP: 4.0000 mg | ORAL_TABLET | Freq: Four times a day (QID) | ORAL | 0 refills | Status: DC | PRN

## 2020-04-12 MED ORDER — FENTANYL CITRATE (PF) 100 MCG/2ML IJ SOLN
INTRAMUSCULAR | Status: AC
  Administered 2020-04-12: 25 ug via INTRAVENOUS
  Filled 2020-04-12: qty 2

## 2020-04-12 MED ORDER — DEXAMETHASONE SODIUM PHOSPHATE 10 MG/ML IJ SOLN
INTRAMUSCULAR | Status: DC | PRN
  Administered 2020-04-12: 10 mg via INTRAVENOUS

## 2020-04-12 MED ORDER — LIDOCAINE HCL (PF) 2 % IJ SOLN
INTRAMUSCULAR | Status: AC
  Filled 2020-04-12: qty 5

## 2020-04-12 MED ORDER — FENTANYL CITRATE (PF) 100 MCG/2ML IJ SOLN
INTRAMUSCULAR | Status: DC | PRN
  Administered 2020-04-12: 100 ug via INTRAVENOUS

## 2020-04-12 MED ORDER — OXYCODONE-ACETAMINOPHEN 5-325 MG PO TABS: 1.0000 | ORAL_TABLET | ORAL | 0 refills | Status: DC | PRN

## 2020-04-12 MED ORDER — LEVONORGESTREL 20 MCG/24HR IU IUD
INTRAUTERINE_SYSTEM | INTRAUTERINE | Status: AC
  Filled 2020-04-12: qty 1

## 2020-04-12 MED ORDER — OXYCODONE-ACETAMINOPHEN 5-325 MG PO TABS: 1.0000 | ORAL_TABLET | Freq: Once | ORAL | Status: AC

## 2020-04-12 MED ORDER — PROPOFOL 10 MG/ML IV BOLUS
INTRAVENOUS | Status: DC | PRN
Start: 2020-04-12 — End: 2020-04-12
  Administered 2020-04-12: 200 mg via INTRAVENOUS

## 2020-04-12 MED ORDER — LEVONORGESTREL 20 MCG/24HR IU IUD
INTRAUTERINE_SYSTEM | INTRAUTERINE | Status: DC
  Filled 2020-04-12: qty 1

## 2020-04-12 MED ORDER — PHENYLEPHRINE HCL (PRESSORS) 10 MG/ML IV SOLN
INTRAVENOUS | Status: DC | PRN
  Administered 2020-04-12: 50 ug via INTRAVENOUS
  Administered 2020-04-12 (×2): 100 ug via INTRAVENOUS

## 2020-04-12 MED ORDER — ONDANSETRON HCL 4 MG/2ML IJ SOLN: 4.0000 mg | Freq: Once | INTRAMUSCULAR | Status: DC | PRN

## 2020-04-12 SURGICAL SUPPLY — 39 items
ANCHOR TIS RET SYS 235ML (MISCELLANEOUS) IMPLANT
BAG URINE DRAIN 2000ML AR STRL (UROLOGICAL SUPPLIES) ×2 IMPLANT
BLADE SURG SZ11 CARB STEEL (BLADE) ×2 IMPLANT
CANISTER SUCT 1200ML W/VALVE (MISCELLANEOUS) IMPLANT
CATH FOLEY 2WAY  5CC 16FR (CATHETERS) ×1
CATH URTH 16FR FL 2W BLN LF (CATHETERS) ×1 IMPLANT
CHLORAPREP W/TINT 26 (MISCELLANEOUS) ×2 IMPLANT
COVER WAND RF STERILE (DRAPES) IMPLANT
DERMABOND ADVANCED (GAUZE/BANDAGES/DRESSINGS) ×1
DERMABOND ADVANCED .7 DNX12 (GAUZE/BANDAGES/DRESSINGS) ×1 IMPLANT
GLOVE SURG ENC MOIS LTX SZ7 (GLOVE) ×2 IMPLANT
GLOVE SURG UNDER LTX SZ7.5 (GLOVE) ×2 IMPLANT
GOWN STRL REUS W/ TWL LRG LVL3 (GOWN DISPOSABLE) ×2 IMPLANT
GOWN STRL REUS W/ TWL XL LVL3 (GOWN DISPOSABLE) IMPLANT
GOWN STRL REUS W/TWL LRG LVL3 (GOWN DISPOSABLE) ×2
GOWN STRL REUS W/TWL XL LVL3 (GOWN DISPOSABLE)
GRASPER SUT TROCAR 14GX15 (MISCELLANEOUS) IMPLANT
IRRIGATION STRYKERFLOW (MISCELLANEOUS) IMPLANT
IRRIGATOR STRYKERFLOW (MISCELLANEOUS)
IV LACTATED RINGERS 1000ML (IV SOLUTION) IMPLANT
KIT PINK PAD W/HEAD ARE REST (MISCELLANEOUS) ×2
KIT PINK PAD W/HEAD ARM REST (MISCELLANEOUS) ×1 IMPLANT
KIT TURNOVER CYSTO (KITS) ×2 IMPLANT
LABEL OR SOLS (LABEL) ×2 IMPLANT
MANIFOLD NEPTUNE II (INSTRUMENTS) ×2 IMPLANT
NS IRRIG 500ML POUR BTL (IV SOLUTION) ×2 IMPLANT
PACK GYN LAPAROSCOPIC (MISCELLANEOUS) ×2 IMPLANT
PAD OB MATERNITY 4.3X12.25 (PERSONAL CARE ITEMS) ×2 IMPLANT
PAD PREP 24X41 OB/GYN DISP (PERSONAL CARE ITEMS) ×2 IMPLANT
SCISSORS METZENBAUM CVD 33 (INSTRUMENTS) IMPLANT
SET TUBE SMOKE EVAC HIGH FLOW (TUBING) ×2 IMPLANT
SHEARS HARMONIC ACE PLUS 36CM (ENDOMECHANICALS) ×2 IMPLANT
SLEEVE ENDOPATH XCEL 5M (ENDOMECHANICALS) ×4 IMPLANT
SUT MNCRL AB 4-0 PS2 18 (SUTURE) ×2 IMPLANT
SUT VIC AB 2-0 UR6 27 (SUTURE) ×2 IMPLANT
SYS MIRENA INTRAUTERINE (MISCELLANEOUS) ×2
SYSTEM MIRENA INTRAUTERINE (MISCELLANEOUS) ×1 IMPLANT
TROCAR ENDO BLADELESS 11MM (ENDOMECHANICALS) IMPLANT
TROCAR XCEL NON-BLD 5MMX100MML (ENDOMECHANICALS) ×2 IMPLANT

## 2020-04-12 NOTE — Transfer of Care (Signed)
Immediate Anesthesia Transfer of Care Note  Patient: Suzanne Stout  Procedure(s) Performed: LAPAROSCOPIC BILATERAL SALPINGECTOMY; IUD INSERTION (Bilateral Abdomen)  Patient Location: PACU  Anesthesia Type:General  Level of Consciousness: awake, alert  and oriented  Airway & Oxygen Therapy: Patient Spontanous Breathing and Patient connected to face mask oxygen  Post-op Assessment: Report given to RN and Post -op Vital signs reviewed and stable  Post vital signs: Reviewed and stable  Last Vitals:  Vitals Value Taken Time  BP 108/69 04/12/20 1645  Temp    Pulse 93 04/12/20 1646  Resp 16 04/12/20 1646  SpO2 100 % 04/12/20 1646  Vitals shown include unvalidated device data.  Last Pain:  Vitals:   04/12/20 1322  TempSrc: Oral  PainSc: 0-No pain         Complications: No complications documented.

## 2020-04-12 NOTE — Interval H&P Note (Signed)
History and Physical Interval Note:  04/12/2020 2:30 PM  Suzanne Stout  has presented today for surgery, with the diagnosis of Unwanted fertility.  The various methods of treatment have been discussed with the patient and family. After consideration of risks, benefits and other options for treatment, the patient has consented to  Procedure(s): LAPAROSCOPIC BILATERAL SALPINGECTOMY (Bilateral) and MIRENA IUD removal as a surgical intervention.  The patient's history has been reviewed, patient examined, no change in status, stable for surgery.  I have reviewed the patient's chart and labs.  Questions were answered to the patient's satisfaction.     Malachy Mood

## 2020-04-12 NOTE — Discharge Instructions (Signed)

## 2020-04-12 NOTE — Anesthesia Postprocedure Evaluation (Signed)
Anesthesia Post Note  Patient: Suzanne Stout  Procedure(s) Performed: LAPAROSCOPIC BILATERAL SALPINGECTOMY; IUD INSERTION (Bilateral Abdomen)  Patient location during evaluation: PACU Anesthesia Type: General Level of consciousness: awake and alert Pain management: pain level controlled Vital Signs Assessment: post-procedure vital signs reviewed and stable Respiratory status: spontaneous breathing and respiratory function stable Cardiovascular status: stable Anesthetic complications: no   No complications documented.   Last Vitals:  Vitals:   04/12/20 1715 04/12/20 1730  BP: 115/74 117/68  Pulse: 84 78  Resp: 18 18  Temp:  36.6 C  SpO2: 100%     Last Pain:  Vitals:   04/12/20 1730  TempSrc: Temporal  PainSc: 2                  [facility]

## 2020-04-12 NOTE — Anesthesia Procedure Notes (Signed)
Procedure Name: Intubation Date/Time: 04/12/2020 3:50 PM Performed by: Allean Found, CRNA Pre-anesthesia Checklist: Patient identified, Patient being monitored, Timeout performed, Emergency Drugs available and Suction available Patient Re-evaluated:Patient Re-evaluated prior to induction Oxygen Delivery Method: Circle system utilized Preoxygenation: Pre-oxygenation with 100% oxygen Induction Type: IV induction Ventilation: Mask ventilation without difficulty Laryngoscope Size: McGraph and 4 Grade View: Grade I Tube type: Oral Tube size: 7.0 mm Number of attempts: 1 Airway Equipment and Method: Stylet Placement Confirmation: ETT inserted through vocal cords under direct vision,  positive ETCO2 and breath sounds checked- equal and bilateral Secured at: 21 cm Tube secured with: Tape Dental Injury: Teeth and Oropharynx as per pre-operative assessment  Difficulty Due To: Difficult Airway- due to anterior larynx and Difficult Airway- due to limited oral opening

## 2020-04-12 NOTE — Anesthesia Preprocedure Evaluation (Signed)
Anesthesia Evaluation  Patient identified by MRN, date of birth, ID band Patient awake    Reviewed: Allergy & Precautions, H&P , NPO status , Patient's Chart, lab work & pertinent test results, reviewed documented beta blocker date and time   History of Anesthesia Complications (+) PONV, PROLONGED EMERGENCE and history of anesthetic complications  Airway Mallampati: I  TM Distance: >3 FB Neck ROM: full    Dental  (+) Caps, Teeth Intact, Dental Advidsory Given   Pulmonary neg shortness of breath, neg sleep apnea, neg COPD, Recent URI , Resolved,    Pulmonary exam normal breath sounds clear to auscultation       Cardiovascular Exercise Tolerance: Good hypertension, (-) angina(-) CAD, (-) Past MI, (-) Cardiac Stents and (-) CABG Normal cardiovascular exam(-) dysrhythmias (-) Valvular Problems/Murmurs Rhythm:regular Rate:Normal     Neuro/Psych Anxiety negative neurological ROS  negative psych ROS   GI/Hepatic negative GI ROS, Neg liver ROS,   Endo/Other  negative endocrine ROS  Renal/GU negative Renal ROS  negative genitourinary   Musculoskeletal   Abdominal   Peds  Hematology negative hematology ROS (+)   Anesthesia Other Findings Past Medical History: No date: Hypertension No date: Psoriasis No date: RLS (restless legs syndrome)   Reproductive/Obstetrics negative OB ROS                             Anesthesia Physical  Anesthesia Plan  ASA: II  Anesthesia Plan: General   Post-op Pain Management:    Induction: Intravenous  PONV Risk Score and Plan:   Airway Management Planned: Oral ETT  Additional Equipment:   Intra-op Plan:   Post-operative Plan: Extubation in OR  Informed Consent: I have reviewed the patients History and Physical, chart, labs and discussed the procedure including the risks, benefits and alternatives for the proposed anesthesia with the patient or  authorized representative who has indicated his/her understanding and acceptance.     Dental Advisory Given  Plan Discussed with: Anesthesiologist, CRNA and Surgeon  Anesthesia Plan Comments:         Anesthesia Quick Evaluation

## 2020-04-12 NOTE — Op Note (Signed)
Preoperative Diagnosis: 1) 45 y.o. with unwanted fertility 2) History of abnormal uterine bleeding  Postoperative Diagnosis: 1) 45 y.o. with unwanted fertility 2) History of abnormal uterine bleeding 3) Fundal intramural fibroid  Operation Performed: Laparoscopic bilateral salpingectomy, Mirena IUD insertion  Indication: 45 y.o. G1P1001  with undesired fertility, desires permanent sterilization.  Other reversible forms of contraception were discussed with patient; she declines all other modalities. Permanent nature of as well as associated risks of the procedure discussed with patient including but not limited to: risk of regret, permanence of method, bleeding, infection, injury to surrounding organs and need for additional procedures.    Patient plans IUD, and will schedule soon.  Risks and benefits of IUD discussed including the risks of irregular bleeding, cramping, infection, malpositioning, expulsion or uterine perforation of the IUD (1:1000 placements)  which may require further procedures such as laparoscopy.  IUDs while effective at preventing pregnancy do not prevent transmission of sexually transmitted diseases and use of barrier methods for this purpose was discussed.  Low overall incidence of failure with 99.7% efficacy rate in typical use.  The patient has not contraindication to IUD placement.  Surgeon: Malachy Mood, MD  Anesthesia: General  Preoperative Antibiotics: none  Estimated Blood Loss: 10 mL  IV Fluids: 65mL  Urine Output:: 384mL  Drains or Tubes: none  Implants: none  Specimens Removed: none  Complications: none  Intraoperative Findings: Normal tubes and ovaries.  Uterus with 3cm fundal intramural fibroid.  Normal liver edge and gallbladder. Normal appendix.  Normal ureters.    Patient Condition: stable  Procedure in Detail:  Patient was taken to the operating room where she was administered general anesthesia.  She was positioned in the dorsal  lithotomy position utilizing Allen stirups, prepped and draped in the usual sterile fashion.  Prior to proceeding with procedure a time out was performed.  Attention was turned to the patient's pelvis.  An indwelling foley catheter was used to empty the patient's bladder.  An operative speculum was placed to allow visualization of the cervix.  The anterior lip of the cervix was grasped with a single tooth tenaculum, and a Hulka tenaculum was placed to allow manipulation of the uterus.  The operative speculum and single tooth tenaculum were then removed.  Attention was turned to the patient's abdomen.  The umbilicus was infiltrated with 1% Sensorcaine, before making a stab incision using an 11 blade scalpel.  A 57mm Excel trocar was then used to gain direct entry into the peritoneal cavity utilizing the camera to visualize progress of the trocar during placement.  Once peritoneal entry had been achieved, insufflation was started and pneumoperitoneum established at a pressure of 70mmHg.   General inspection of the abdomen revealed the above noted findings.  A spot 2cm midline above the pubic symphysis was injected with 1% Sensorcaine and a stab incision was made using an 11 blade scalpel.  Two 37mm excel trocars, one left lower quadrant and one left upper quadrant port, were placed under direct visualization.  The right tube was identified and grasped in a fimbriated end.  The tube was transected from it attachments to the ovary, mesosalpinx, and uterus using a 21mm Harmonic scalpel.  The left tube was identified and removed in a similar fashion.  Pneumoperitoneum was evacuated.  The trocars were removed.  The left upper quadrant trocar was closed using 4-0 Monocryl in a subcuticular fashion.  All trocar sites were then dressed with surgical skin glue.  The Hulka tenaculum was removed.  Speculum placed in the vagina.  Cervix visualized.  Grasped anteriorly with a single tooth tenaculum.  Uterus sounded to 9cm. IUD  placed per manufacturer's recommendations.  Strings trimmed to 3 cm. Tenaculum was removed, good hemostasis noted.  Sponge needle and instrument counts were correct time two.  The patient tolerated the procedure well and was taken to the recovery room in stable condition.

## 2020-04-13 ENCOUNTER — Encounter: Payer: Self-pay | Admitting: Obstetrics and Gynecology

## 2020-04-14 LAB — SURGICAL PATHOLOGY

## 2020-04-21 ENCOUNTER — Encounter: Payer: Self-pay | Admitting: Obstetrics and Gynecology

## 2020-04-21 ENCOUNTER — Other Ambulatory Visit: Payer: Self-pay

## 2020-04-21 ENCOUNTER — Ambulatory Visit (INDEPENDENT_AMBULATORY_CARE_PROVIDER_SITE_OTHER): Payer: BC Managed Care – PPO | Admitting: Obstetrics and Gynecology

## 2020-04-21 VITALS — BP 110/60 | Wt 171.0 lb

## 2020-04-21 DIAGNOSIS — Z4889 Encounter for other specified surgical aftercare: Secondary | ICD-10-CM

## 2020-04-21 NOTE — Progress Notes (Signed)
      Postoperative Follow-up Patient presents post op from laparoscopic bilateral salpingectomy and Mirena IUD placement 1weeks ago for abnormal uterine bleeding and requested sterilization.  Subjective: Patient reports marked improvement in her preop symptoms. Eating a regular diet without difficulty. Pain is controlled without any medications.  Activity: normal activities of daily living.  Objective: Blood pressure 110/60, weight 171 lb (77.6 kg).  General: NAD Pulmonary: no increased work of breathing Abdomen: soft, non-tender, non-distended, incision(s) D/C/I Extremities: no edema Neurologic: normal gait    Admission on 04/12/2020, Discharged on 04/12/2020  Component Date Value Ref Range Status  . Preg Test, Ur 04/12/2020 NEGATIVE  NEGATIVE Final   Comment:        THE SENSITIVITY OF THIS METHODOLOGY IS >24 mIU/mL   . SURGICAL PATHOLOGY 04/12/2020    Final-Edited                   Value:SURGICAL PATHOLOGY CASE: [id number] PATIENT: Suzanne Stout Surgical Pathology Report     Specimen Submitted: A. Fallopian tubes, bilateral  Clinical History: Unwanted fertility    DIAGNOSIS: A. FALLOPIAN TUBE SEGMENTS, BILATERAL; STERILIZATION: - BILATERAL FALLOPIAN TUBES WITH NO SIGNIFICANT HISTOPATHOLOGIC CHANGE, SEEN ON FULL CROSS SECTION X4 EACH.  GROSS DESCRIPTION: A. Labeled: Bilateral fallopian tubes Received: Formalin Collection time: 4:16 PM on 04/12/2020 Placed into formalin time: 4:21 PM on 04/12/2020 Size: Fallopian tube A: 5.5 cm long by 0.6 cm in diameter; fallopian tube B: 5.6 cm long by 0.7 cm in diameter Other findings: Both fallopian tubes are fimbriated with tan-purple, smooth, and glistening serosal surfaces.  The lumens are pinpoint and patent.  Fallopian tube B is inked blue for differentiation.  Block summary: 1 - fallopian tube A fimbria, longitudinally bisected and submitted entirely, with representative cross-sections 2 - f                          allopian tube B fimbria, longitudinally bisected and submitted entirely 3 - representative fallopian tube B cross-sections  RB 04/13/2020   Final Diagnosis performed by Allena Napoleon, MD.   Electronically signed 04/14/2020 9:51:09AM The electronic signature indicates that the named Attending Pathologist has evaluated the specimen Technical component performed at [facility], [address] [place], [place] [address] Lab: [phone] Dir: Rush Farmer, MD, MMM  Professional component performed at [facility], [facility], [place], [place], [place] [address] Lab: [phone] Dir: Dellia Nims. Reuel Derby, MD     Assessment: 45 y.o. s/p laparoscopic bilateral salpingectomy and Mirena IUD placement stable  Plan: Patient has done well after surgery with no apparent complications.  I have discussed the post-operative course to date, and the expected progress moving forward.  The patient understands what complications to be concerned about.  I will see the patient in routine follow up, or sooner if needed.    Activity plan: No restriction.  Discussed uterine fibroid and management options including expectant, [place], or hysterectomy.     Malachy Mood, MD, [facility], [facility] 04/21/2020, 2:43 PM

## 2020-05-27 ENCOUNTER — Ambulatory Visit: Payer: BC Managed Care – PPO | Admitting: Obstetrics and Gynecology

## 2020-06-03 ENCOUNTER — Encounter: Payer: Self-pay | Admitting: Obstetrics and Gynecology

## 2020-06-03 ENCOUNTER — Other Ambulatory Visit: Payer: Self-pay

## 2020-06-03 ENCOUNTER — Ambulatory Visit: Payer: BC Managed Care – PPO | Admitting: Obstetrics and Gynecology

## 2020-06-03 VITALS — BP 112/60 | Wt 176.0 lb

## 2020-06-03 DIAGNOSIS — Z4889 Encounter for other specified surgical aftercare: Secondary | ICD-10-CM

## 2020-06-03 DIAGNOSIS — D251 Intramural leiomyoma of uterus: Secondary | ICD-10-CM

## 2020-06-03 DIAGNOSIS — Z30431 Encounter for routine checking of intrauterine contraceptive device: Secondary | ICD-10-CM

## 2020-06-03 NOTE — Progress Notes (Signed)
      Postoperative Follow-up Patient presents post op from laparoscopic bilateral salpingectomy, Mirena IUD insertion 6weeks ago for requested sterilization, AUB.  Subjective: Patient reports marked improvement in her preop symptoms. Eating a regular diet without difficulty. The patient is not having any pain.  Activity: normal activities of daily living.  She has done well with IUD and achieved amenorrhea.    Objective: Blood pressure 112/60, weight 176 lb (79.8 kg).  General: NAD Pulmonary: no increased work of breathing Abdomen: soft, non-tender, non-distended, incision(s) D/C/I GU: normal external female genitalia normal cervix, no CMT, uterus normal in shape and contour, no adnexal tenderness or masses, IUD strings visualized 3cm Extremities: no edema Neurologic: normal gait    Admission on 04/12/2020, Discharged on 04/12/2020  Component Date Value Ref Range Status  . Preg Test, Ur 04/12/2020 NEGATIVE  NEGATIVE Final   Comment:        THE SENSITIVITY OF THIS METHODOLOGY IS >24 mIU/mL   . SURGICAL PATHOLOGY 04/12/2020    Final-Edited                   Value:SURGICAL PATHOLOGY CASE: [id number] PATIENT: Chevis Pretty Surgical Pathology Report     Specimen Submitted: A. Fallopian tubes, bilateral  Clinical History: Unwanted fertility    DIAGNOSIS: A. FALLOPIAN TUBE SEGMENTS, BILATERAL; STERILIZATION: - BILATERAL FALLOPIAN TUBES WITH NO SIGNIFICANT HISTOPATHOLOGIC CHANGE, SEEN ON FULL CROSS SECTION X4 EACH.  GROSS DESCRIPTION: A. Labeled: Bilateral fallopian tubes Received: Formalin Collection time: 4:16 PM on 04/12/2020 Placed into formalin time: 4:21 PM on 04/12/2020 Size: Fallopian tube A: 5.5 cm long by 0.6 cm in diameter; fallopian tube B: 5.6 cm long by 0.7 cm in diameter Other findings: Both fallopian tubes are fimbriated with tan-purple, smooth, and glistening serosal surfaces.  The lumens are pinpoint and patent.  Fallopian tube B is inked blue  for differentiation.  Block summary: 1 - fallopian tube A fimbria, longitudinally bisected and submitted entirely, with representative cross-sections 2 - f                         allopian tube B fimbria, longitudinally bisected and submitted entirely 3 - representative fallopian tube B cross-sections  RB 04/13/2020   Final Diagnosis performed by Allena Napoleon, MD.   Electronically signed 04/14/2020 9:51:09AM The electronic signature indicates that the named Attending Pathologist has evaluated the specimen Technical component performed at [facility], [address] [place], [place] [address] Lab: [phone] Dir: Rush Farmer, MD, MMM  Professional component performed at [facility], [facility], [place], [place], [place] [address] Lab: [phone] Dir: Dellia Nims. Reuel Derby, MD     Assessment: 45 y.o. s/p laparoscopic bilateral salpingectomy Mirena IUD insertion stable  Plan: Patient has done well after surgery with no apparent complications.  I have discussed the post-operative course to date, and the expected progress moving forward.  The patient understands what complications to be concerned about.  I will see the patient in routine follow up, or sooner if needed.    Activity plan: No restriction.  Given uterine fibroid patient interested in proceeding with hysterectomy sometime in November   Kashmere Staffa, MD, [place], [place] 06/03/2020, 3:29 PM

## 2020-06-07 ENCOUNTER — Other Ambulatory Visit: Payer: Self-pay | Admitting: Family Medicine

## 2020-06-07 ENCOUNTER — Ambulatory Visit
Admission: RE | Admit: 2020-06-07 | Discharge: 2020-06-07 | Disposition: A | Discharge status: Home / Self Care | Payer: BC Managed Care – PPO | Source: Ambulatory Visit | Attending: Family Medicine | Admitting: Family Medicine

## 2020-06-07 ENCOUNTER — Ambulatory Visit
Admission: RE | Admit: 2020-06-07 | Discharge: 2020-06-07 | Disposition: A | Discharge status: Home / Self Care | Payer: BC Managed Care – PPO | Attending: Family Medicine | Admitting: Family Medicine

## 2020-06-07 ENCOUNTER — Other Ambulatory Visit: Payer: Self-pay

## 2020-06-07 DIAGNOSIS — R109 Unspecified abdominal pain: Secondary | ICD-10-CM | POA: Insufficient documentation

## 2020-07-25 ENCOUNTER — Telehealth: Payer: BC Managed Care – PPO

## 2020-07-25 NOTE — Telephone Encounter (Signed)
Left a message for the patient to return the call.  

## 2020-07-25 NOTE — Telephone Encounter (Signed)
-----   Message from Malachy Mood, MD sent at 06/03/2020  3:34 PM EDT ----- Regarding: Surgery Surgery Booking Request Patient Full Name:  Suzanne Stout  MRN: [id number]  DOB: December 11, 1975  Surgeon: Malachy Mood, MD  Requested Surgery Date and Time: Sometime in November Primary Diagnosis AND Code: D25.1 intramural fibroid Secondary Diagnosis and Code:  Surgical Procedure: Total laparoscopic hysterectomy RNFA Requested?: No L&D Notification: No Admission Status: observation Length of Surgery: 75 min Special Case Needs: No H&P: Yes Phone Interview???:  No Interpreter: No Medical Clearance:  No Special Scheduling Instructions: No Any known health/anesthesia issues, diabetes, sleep apnea, latex allergy, defibrillator/pacemaker?: No Acuity: P3   (P1 highest, P2 delay may cause harm, P3 low, elective gyn, P4 lowest)

## 2020-08-03 NOTE — Telephone Encounter (Signed)
-----   Message from Malachy Mood, MD sent at 06/03/2020  3:34 PM EDT ----- Regarding: Surgery Surgery Booking Request Patient Full Name:  RHYANNA SORCE  MRN: [id number]  DOB: 1975-11-05  Surgeon: Malachy Mood, MD  Requested Surgery Date and Time: Sometime in November Primary Diagnosis AND Code: D25.1 intramural fibroid Secondary Diagnosis and Code:  Surgical Procedure: Total laparoscopic hysterectomy RNFA Requested?: No L&D Notification: No Admission Status: observation Length of Surgery: 75 min Special Case Needs: No H&P: Yes Phone Interview???:  No Interpreter: No Medical Clearance:  No Special Scheduling Instructions: No Any known health/anesthesia issues, diabetes, sleep apnea, latex allergy, defibrillator/pacemaker?: No Acuity: P3   (P1 highest, P2 delay may cause harm, P3 low, elective gyn, P4 lowest)

## 2020-08-03 NOTE — Telephone Encounter (Signed)
Called patient to schedule TLH w Georgianne Fick  DOS 8/18  H&P 8/12 @ 1:50   Pre-admit phone call appointment to be requested - date and time will be included on H&P paper work. Also all appointments will be updated on pt MyChart. Explained that this appointment has a call window. Based on the time scheduled will indicate if the call will be received within a 4 hour window before 1:00 or after.  Advised that pt may also receive calls from the hospital pharmacy and pre-service center.  Confirmed pt has [employer] as Chartered certified accountant. No secondary insurance.

## 2020-08-17 NOTE — Telephone Encounter (Signed)
Called patient to inform her of the change to her appt from 8/12 to 8/16

## 2020-08-25 ENCOUNTER — Other Ambulatory Visit: Payer: Self-pay

## 2020-08-25 ENCOUNTER — Encounter
Admission: RE | Admit: 2020-08-25 | Discharge: 2020-08-25 | Disposition: A | Discharge status: Home / Self Care | Payer: BC Managed Care – PPO | Source: Ambulatory Visit | Attending: Obstetrics and Gynecology | Admitting: Obstetrics and Gynecology

## 2020-08-25 NOTE — Patient Instructions (Signed)
Your procedure is scheduled on: September 01, 2020 THURSDAY Report to the Registration Desk on the 1st floor of the [facility]. To find out your arrival time, please call [phone] between 1PM - 3PM on:  Wednesday August 31, 2020  REMEMBER: Instructions that are not followed completely may result in serious medical risk, up to and including death; or upon the discretion of your surgeon and anesthesiologist your surgery may need to be rescheduled.  DO NOT EAT OR DRINK after midnight the night before surgery.   TAKE THESE MEDICATIONS THE MORNING OF SURGERY WITH A SIP OF WATER: NONE  One week prior to surgery: Stop Anti-inflammatories (NSAIDS) such as Advil, Aleve, Ibuprofen, Motrin, Naproxen, Naprosyn and ASPIRIN OR Aspirin based products such as Excedrin, Goodys Powder, BC Powder. Stop ANY OVER THE COUNTER supplements until after surgery. You may however, continue to take Tylenol if needed for pain up until the day of surgery.  No Alcohol for 24 hours before or after surgery.  No Smoking including e-cigarettes for 24 hours prior to surgery.  No chewable tobacco products for at least 6 hours prior to surgery.  No nicotine patches on the day of surgery.  Do not use any "recreational" drugs for at least a week prior to your surgery.  Please be advised that the combination of cocaine and anesthesia may have negative outcomes, up to and including death. If you test positive for cocaine, your surgery will be cancelled.  On the morning of surgery brush your teeth with toothpaste and water, you may rinse your mouth with mouthwash if you wish. Do not swallow any toothpaste or mouthwash.  Do not wear jewelry, make-up, hairpins, clips or nail polish.  Do not wear lotions, powders, or perfumes OR DEODORANT  Do not shave body from the neck down 48 hours prior to surgery just in case you cut yourself which could leave a site for infection.  Also, freshly shaved skin may become irritated if  using the CHG soap.  Contact lenses, hearing aids and dentures may not be worn into surgery.  Do not bring valuables to the hospital. [facility] is not responsible for any missing/lost belongings or valuables.   Use CHG Soap as directed on instruction sheet.  Notify your doctor if there is any change in your medical condition (cold, fever, infection).  Wear comfortable clothing (specific to your surgery type) to the hospital.  After surgery, you can help prevent lung complications by doing breathing exercises.  Take deep breaths and cough every 1-2 hours. Your doctor may order a device called an Incentive Spirometer to help you take deep breaths. When coughing or sneezing, hold a pillow firmly against your incision with both hands. This is called "splinting." Doing this helps protect your incision. It also decreases belly discomfort.  If you are being admitted to the hospital overnight, YOU MAY [place].  If you are being discharged the day of surgery, you will not be allowed to drive home. You will need a responsible adult (18 years or older) to drive you home and stay with you that night.   If you are taking public transportation, you will need to have a responsible adult (18 years or older) with you. Please confirm with your physician that it is acceptable to use public transportation.   Please call the [facility]. at [phone] if you have any questions about these instructions.  Surgery Visitation Policy:  Patients undergoing a surgery or procedure may  have one family member or support person with them as long as that person is not COVID-19 positive or experiencing its symptoms.  That person may remain in the waiting area during the procedure.  Inpatient Visitation:    Visiting hours are 7 a.m. to 8 p.m. Inpatients will be allowed two visitors daily. The visitors may change each day during the patient's stay. No visitors under the age of 82.  Any visitor under the age of 60 must be accompanied by an adult. The visitor must pass COVID-19 screenings, use hand sanitizer when entering and exiting the patient's room and wear a mask at all times, including in the patient's room. Patients must also wear a mask when staff or their visitor are in the room. Masking is required regardless of vaccination status.

## 2020-08-26 ENCOUNTER — Other Ambulatory Visit
Admission: RE | Admit: 2020-08-26 | Discharge: 2020-08-26 | Disposition: A | Discharge status: Home / Self Care | Payer: BC Managed Care – PPO | Source: Ambulatory Visit | Attending: Obstetrics and Gynecology | Admitting: Obstetrics and Gynecology

## 2020-08-26 ENCOUNTER — Encounter: Payer: Self-pay | Admitting: Urgent Care

## 2020-08-26 ENCOUNTER — Encounter: Payer: BC Managed Care – PPO | Admitting: Obstetrics and Gynecology

## 2020-08-26 DIAGNOSIS — Z01812 Encounter for preprocedural laboratory examination: Secondary | ICD-10-CM | POA: Diagnosis present

## 2020-08-26 LAB — CBC
HCT: 36.4 % (ref 36.0–46.0)
Hemoglobin: 12.6 g/dL (ref 12.0–15.0)
MCH: 32.1 pg (ref 26.0–34.0)
MCHC: 34.6 g/dL (ref 30.0–36.0)
MCV: 92.6 fL (ref 80.0–100.0)
Platelets: 363 10*3/uL (ref 150–400)
RBC: 3.93 MIL/uL (ref 3.87–5.11)
RDW: 12.3 % (ref 11.5–15.5)
WBC: 7.1 10*3/uL (ref 4.0–10.5)
nRBC: 0 % (ref 0.0–0.2)

## 2020-08-26 LAB — BASIC METABOLIC PANEL
Anion gap: 8 (ref 5–15)
BUN: 18 mg/dL (ref 6–20)
CO2: 27 mmol/L (ref 22–32)
Calcium: 9.5 mg/dL (ref 8.9–10.3)
Chloride: 105 mmol/L (ref 98–111)
Creatinine, Ser: 1.01 mg/dL — ABNORMAL HIGH (ref 0.44–1.00)
GFR, Estimated: >60 mL/min (ref >60)
Glucose, Bld: 84 mg/dL (ref 70–99)
Potassium: 3.3 mmol/L — ABNORMAL LOW (ref 3.5–5.1)
Sodium: 140 mmol/L (ref 135–145)

## 2020-08-26 LAB — TYPE AND SCREEN
ABO/RH(D): A NEG
Antibody Screen: NEGATIVE

## 2020-08-30 ENCOUNTER — Other Ambulatory Visit
Admission: RE | Admit: 2020-08-30 | Discharge: 2020-08-30 | Disposition: A | Discharge status: Home / Self Care | Payer: BC Managed Care – PPO | Source: Ambulatory Visit | Attending: Obstetrics and Gynecology | Admitting: Obstetrics and Gynecology

## 2020-08-30 ENCOUNTER — Encounter: Payer: Self-pay | Admitting: Obstetrics and Gynecology

## 2020-08-30 ENCOUNTER — Other Ambulatory Visit: Payer: Self-pay

## 2020-08-30 ENCOUNTER — Ambulatory Visit (INDEPENDENT_AMBULATORY_CARE_PROVIDER_SITE_OTHER): Payer: BC Managed Care – PPO | Admitting: Obstetrics and Gynecology

## 2020-08-30 VITALS — BP 126/82 | Ht 65.0 in | Wt 180.0 lb

## 2020-08-30 DIAGNOSIS — D251 Intramural leiomyoma of uterus: Secondary | ICD-10-CM | POA: Diagnosis not present

## 2020-08-30 DIAGNOSIS — Z01818 Encounter for other preprocedural examination: Secondary | ICD-10-CM

## 2020-08-30 DIAGNOSIS — Z01812 Encounter for preprocedural laboratory examination: Secondary | ICD-10-CM | POA: Diagnosis present

## 2020-08-30 DIAGNOSIS — Z20822 Contact with and (suspected) exposure to covid-19: Secondary | ICD-10-CM | POA: Insufficient documentation

## 2020-08-30 LAB — SARS CORONAVIRUS 2 (TAT 6-24 HRS): SARS Coronavirus 2: NEGATIVE

## 2020-08-30 NOTE — H&P (View-Only) (Signed)
Obstetrics & Gynecology Surgery H&P    Chief Complaint: Scheduled Surgery   History of Present Illness: Patient is a 45 y.o. G1P1001 presenting for scheduled TLH, cystoscopy, for the treatment or further evaluation of uterine fibroid.   Prior Treatments prior to proceeding with surgery include: discussion of alternative treatment option.  Preoperative Pap: 03/15/2020 Results: no abnormalities  Preoperative Endometrial biopsy: N/A Preoperative Ultrasound: N/A  At time of tubal ligation the patient was noted to have a 5cm midline fundal intramural fibroid.   Review of Systems:10 point review of systems  Past Medical History:  Patient Active Problem List   Diagnosis Date Noted   Abnormal uterine bleeding (AUB)    Intramural leiomyoma of uterus    Unwanted fertility    Abdominal pain 01/17/2016   Gastric distention 01/17/2016   HTN (hypertension) 01/17/2016   RLS (restless legs syndrome) 01/17/2016    Past Surgical History:  Past Surgical History:  Procedure Laterality Date   BREAST LUMPECTOMY Right 2002   ESOPHAGOGASTRODUODENOSCOPY (EGD) WITH PROPOFOL N/A 01/19/2016   Procedure: ESOPHAGOGASTRODUODENOSCOPY (EGD) WITH PROPOFOL;  Surgeon: Jonathon Bellows, MD;  Location: [facility] END[facility];  Service: Endoscopy;  Laterality: N/A;   LAPAROSCOPIC BILATERAL SALPINGECTOMY Bilateral 04/12/2020   Procedure: LAPAROSCOPIC BILATERAL SALPINGECTOMY; IUD INSERTION;  Surgeon: Malachy Mood, MD;  Location: [facility] ORS;  Service: Gynecology;  Laterality: Bilateral;    Family History:  Family History  Problem Relation Age of Onset   Gallbladder disease Other    Hypertension Mother    Alcoholism Father     Social History:  Social History   Socioeconomic History   Marital status: Married    Spouse name: Not on file   Number of children: Not on file   Years of education: Not on file   Highest education level: Not on file  Occupational History   Not on file  Tobacco Use   Smoking status:  Never   Smokeless tobacco: Never  Vaping Use   Vaping Use: Never used  Substance and Sexual Activity   Alcohol use: No   Drug use: Never   Sexual activity: Yes    Birth control/protection: None  Other Topics Concern   Not on file  Social History Narrative   Not on file   Social Determinants of Health   Financial Resource Strain: Not on file  Food Insecurity: Not on file  Transportation Needs: Not on file  Physical Activity: Not on file  Stress: Not on file  Social Connections: Not on file  Intimate Partner Violence: Not on file    Allergies:  Allergies  Allergen Reactions   Latex Dermatitis and Rash    Medications: Prior to Admission medications   Medication Sig Start Date End Date Taking? Authorizing Provider  lisinopril-hydrochlorothiazide (ZESTORETIC) 10-12.5 MG tablet Take 1 tablet by mouth every evening.   Yes [provider]  Melatonin 10 MG TABS Take 10 mg by mouth at bedtime as needed (sleep.). Gummies   Yes [provider]  rOPINIRole (REQUIP) 0.25 MG tablet Take 0.25 mg by mouth at bedtime.   Yes [provider]    Physical Exam Vitals: Blood pressure 126/82, height '5\' 5"'$  (1.651 m), weight 180 lb (81.6 kg). General: NAD HEENT: normocephalic, anicteric Pulmonary: No increased work of breathing Cardiovascular: RRR, distal pulses 2+ Abdomen: soft, non-tender, non-distended Genitourinary: deferred Extremities: no edema, erythema, or tenderness Neurologic: Grossly intact Psychiatric: mood appropriate, affect full  Imaging No results found.  Assessment: 45 y.o. G1P1001 presenting for scheduled TLH, cystoscopy  Plan: 1) Patient opts for definitive surgical management via hysterectomy. The risks of surgery were discussed in detail with the patient including but not limited to: bleeding which may require transfusion or reoperation; infection which may require antibiotics; injury to bowel, bladder, ureters or other surrounding organs  (With a literature reported rate of urinary tract injury of 1% quoted); need for additional procedures including laparotomy; thromboembolic phenomenon, incisional problems and other postoperative/anesthesia complications.  Patient was also advised that recovery procedure generally involves an overnight stay; and the  expected recovery time after a hysterectomy being in the range of 6-8 weeks.  Likelihood of success in alleviating the patient's symptoms was discussed.  While definitive in regards to issues with menstural bleeding, pelvic pain if present preoperatively may continue and in fact worsen postoperatively.  She is aware that the procedure will render her unable to pursue childbearing in the future.   She was told that she will be contacted by our surgical scheduler regarding the time and date of her surgery; routine preoperative instructions of having nothing to eat or drink after midnight on the day prior to surgery and also coming to the hospital 1.5 hours prior to her time of surgery were also emphasized.  She was told she may be called for a preoperative appointment about a week prior to surgery and will be given further preoperative instructions at that visit.  Routine postoperative instructions will be reviewed with the patient and her family in detail after surgery. Printed patient education handouts about the procedure was given to the patient to review at home.   2) Routine postoperative instructions were reviewed with the patient and her family in detail today including the expected length of recovery and likely postoperative course.  The patient concurred with the proposed plan, giving informed written consent for the surgery today.  Patient instructed on the importance of being NPO after midnight prior to her procedure.  If warranted preoperative prophylactic antibiotics and SCDs ordered on call to the OR to meet SCIP guidelines and adhere to recommendation laid forth in [place] Number 104 May 2009  "Antibiotic Prophylaxis for Gynecologic Procedures".     Malachy Mood, MD, [facility], [facility] 08/30/2020, 9:59 AM

## 2020-08-30 NOTE — Progress Notes (Signed)
Obstetrics & Gynecology Surgery H&P    Chief Complaint: Scheduled Surgery   History of Present Illness: Patient is a 45 y.o. G1P1001 presenting for scheduled TLH, cystoscopy, for the treatment or further evaluation of uterine fibroid.   Prior Treatments prior to proceeding with surgery include: discussion of alternative treatment option.  Preoperative Pap: 03/15/2020 Results: no abnormalities  Preoperative Endometrial biopsy: N/A Preoperative Ultrasound: N/A  At time of tubal ligation the patient was noted to have a 5cm midline fundal intramural fibroid.   Review of Systems:10 point review of systems  Past Medical History:  Patient Active Problem List   Diagnosis Date Noted   Abnormal uterine bleeding (AUB)    Intramural leiomyoma of uterus    Unwanted fertility    Abdominal pain 01/17/2016   Gastric distention 01/17/2016   HTN (hypertension) 01/17/2016   RLS (restless legs syndrome) 01/17/2016    Past Surgical History:  Past Surgical History:  Procedure Laterality Date   BREAST LUMPECTOMY Right 2002   ESOPHAGOGASTRODUODENOSCOPY (EGD) WITH PROPOFOL N/A 01/19/2016   Procedure: ESOPHAGOGASTRODUODENOSCOPY (EGD) WITH PROPOFOL;  Surgeon: Jonathon Bellows, MD;  Location: [facility];  Service: Endoscopy;  Laterality: N/A;   LAPAROSCOPIC BILATERAL SALPINGECTOMY Bilateral 04/12/2020   Procedure: LAPAROSCOPIC BILATERAL SALPINGECTOMY; IUD INSERTION;  Surgeon: Malachy Mood, MD;  Location: [facility] ORS;  Service: Gynecology;  Laterality: Bilateral;    Family History:  Family History  Problem Relation Age of Onset   Gallbladder disease Other    Hypertension Mother    Alcoholism Father     Social History:  Social History   Socioeconomic History   Marital status: Married    Spouse name: Not on file   Number of children: Not on file   Years of education: Not on file   Highest education level: Not on file  Occupational History   Not on file  Tobacco Use   Smoking status:  Never   Smokeless tobacco: Never  Vaping Use   Vaping Use: Never used  Substance and Sexual Activity   Alcohol use: No   Drug use: Never   Sexual activity: Yes    Birth control/protection: None  Other Topics Concern   Not on file  Social History Narrative   Not on file   Social Determinants of Health   Financial Resource Strain: Not on file  Food Insecurity: Not on file  Transportation Needs: Not on file  Physical Activity: Not on file  Stress: Not on file  Social Connections: Not on file  Intimate Partner Violence: Not on file    Allergies:  Allergies  Allergen Reactions   Latex Dermatitis and Rash    Medications: Prior to Admission medications   Medication Sig Start Date End Date Taking? Authorizing Provider  lisinopril-hydrochlorothiazide (ZESTORETIC) 10-12.5 MG tablet Take 1 tablet by mouth every evening.   Yes [provider]  Melatonin 10 MG TABS Take 10 mg by mouth at bedtime as needed (sleep.). Gummies   Yes [provider]  rOPINIRole (REQUIP) 0.25 MG tablet Take 0.25 mg by mouth at bedtime.   Yes [provider]    Physical Exam Vitals: Blood pressure 126/82, height '5\' 5"'$  (1.651 m), weight 180 lb (81.6 kg). General: NAD HEENT: normocephalic, anicteric Pulmonary: No increased work of breathing Cardiovascular: RRR, distal pulses 2+ Abdomen: soft, non-tender, non-distended Genitourinary: deferred Extremities: no edema, erythema, or tenderness Neurologic: Grossly intact Psychiatric: mood appropriate, affect full  Imaging No results found.  Assessment: 45 y.o. G1P1001 presenting for scheduled TLH, cystoscopy  Plan: 1) Patient opts for definitive surgical management via hysterectomy. The risks of surgery were discussed in detail with the patient including but not limited to: bleeding which may require transfusion or reoperation; infection which may require antibiotics; injury to bowel, bladder, ureters or other surrounding organs  (With a literature reported rate of urinary tract injury of 1% quoted); need for additional procedures including laparotomy; thromboembolic phenomenon, incisional problems and other postoperative/anesthesia complications.  Patient was also advised that recovery procedure generally involves an overnight stay; and the  expected recovery time after a hysterectomy being in the range of 6-8 weeks.  Likelihood of success in alleviating the patient's symptoms was discussed.  While definitive in regards to issues with menstural bleeding, pelvic pain if present preoperatively may continue and in fact worsen postoperatively.  She is aware that the procedure will render her unable to pursue childbearing in the future.   She was told that she will be contacted by our surgical scheduler regarding the time and date of her surgery; routine preoperative instructions of having nothing to eat or drink after midnight on the day prior to surgery and also coming to the hospital 1.5 hours prior to her time of surgery were also emphasized.  She was told she may be called for a preoperative appointment about a week prior to surgery and will be given further preoperative instructions at that visit.  Routine postoperative instructions will be reviewed with the patient and her family in detail after surgery. Printed patient education handouts about the procedure was given to the patient to review at home.   2) Routine postoperative instructions were reviewed with the patient and her family in detail today including the expected length of recovery and likely postoperative course.  The patient concurred with the proposed plan, giving informed written consent for the surgery today.  Patient instructed on the importance of being NPO after midnight prior to her procedure.  If warranted preoperative prophylactic antibiotics and SCDs ordered on call to the OR to meet SCIP guidelines and adhere to recommendation laid forth in [place] Number 104 May 2009  "Antibiotic Prophylaxis for Gynecologic Procedures".     Malachy Mood, MD, [facility], [facility] 08/30/2020, 9:59 AM

## 2020-09-01 ENCOUNTER — Encounter: Payer: Self-pay | Admitting: Obstetrics and Gynecology

## 2020-09-01 ENCOUNTER — Ambulatory Visit: Payer: BC Managed Care – PPO | Admitting: Certified Registered Nurse Anesthetist

## 2020-09-01 ENCOUNTER — Encounter
Admission: RE | Disposition: A | Discharge status: Home / Self Care | Payer: Self-pay | Source: Home / Self Care | Attending: Obstetrics and Gynecology

## 2020-09-01 ENCOUNTER — Observation Stay
Admission: RE | Admit: 2020-09-01 | Discharge: 2020-09-01 | Disposition: A | Discharge status: Home / Self Care | Payer: BC Managed Care – PPO | Attending: Obstetrics and Gynecology | Admitting: Obstetrics and Gynecology

## 2020-09-01 ENCOUNTER — Other Ambulatory Visit: Payer: Self-pay

## 2020-09-01 DIAGNOSIS — N8312 Corpus luteum cyst of left ovary: Secondary | ICD-10-CM

## 2020-09-01 DIAGNOSIS — N72 Inflammatory disease of cervix uteri: Secondary | ICD-10-CM | POA: Insufficient documentation

## 2020-09-01 DIAGNOSIS — I10 Essential (primary) hypertension: Secondary | ICD-10-CM | POA: Insufficient documentation

## 2020-09-01 DIAGNOSIS — Z91040 Latex allergy status: Secondary | ICD-10-CM | POA: Diagnosis not present

## 2020-09-01 DIAGNOSIS — N859 Noninflammatory disorder of uterus, unspecified: Secondary | ICD-10-CM | POA: Diagnosis not present

## 2020-09-01 DIAGNOSIS — D251 Intramural leiomyoma of uterus: Secondary | ICD-10-CM | POA: Diagnosis not present

## 2020-09-01 DIAGNOSIS — Z90710 Acquired absence of both cervix and uterus: Secondary | ICD-10-CM

## 2020-09-01 DIAGNOSIS — Z79899 Other long term (current) drug therapy: Secondary | ICD-10-CM | POA: Diagnosis not present

## 2020-09-01 DIAGNOSIS — N858 Other specified noninflammatory disorders of uterus: Secondary | ICD-10-CM | POA: Diagnosis not present

## 2020-09-01 DIAGNOSIS — D259 Leiomyoma of uterus, unspecified: Secondary | ICD-10-CM | POA: Diagnosis present

## 2020-09-01 HISTORY — PX: LAPAROSCOPIC HYSTERECTOMY: SHX1926

## 2020-09-01 LAB — ABO/RH: ABO/RH(D): A NEG

## 2020-09-01 LAB — POCT PREGNANCY, URINE: Preg Test, Ur: NEGATIVE

## 2020-09-01 SURGERY — HYSTERECTOMY, TOTAL, LAPAROSCOPIC
Anesthesia: General

## 2020-09-01 MED ORDER — ORAL CARE MOUTH RINSE: 15.0000 mL | Freq: Once | OROMUCOSAL | Status: AC

## 2020-09-01 MED ORDER — ACETAMINOPHEN 10 MG/ML IV SOLN: 1000.0000 mg | Freq: Once | INTRAVENOUS | Status: DC | PRN

## 2020-09-01 MED ORDER — SODIUM CHLORIDE 0.9 % IR SOLN
Status: DC | PRN
  Administered 2020-09-01: 200 mL

## 2020-09-01 MED ORDER — MENTHOL 3 MG MT LOZG
1.0000 | LOZENGE | OROMUCOSAL | Status: DC | PRN
  Filled 2020-09-01: qty 9

## 2020-09-01 MED ORDER — GLYCOPYRROLATE 0.2 MG/ML IJ SOLN
INTRAMUSCULAR | Status: AC
  Filled 2020-09-01: qty 1

## 2020-09-01 MED ORDER — BUPIVACAINE HCL (PF) 0.5 % IJ SOLN
INTRAMUSCULAR | Status: AC
  Filled 2020-09-01: qty 30

## 2020-09-01 MED ORDER — IBUPROFEN 600 MG PO TABS: 600.0000 mg | ORAL_TABLET | Freq: Four times a day (QID) | ORAL | 0 refills | Status: DC

## 2020-09-01 MED ORDER — BUPIVACAINE HCL 0.5 % IJ SOLN
INTRAMUSCULAR | Status: DC | PRN
  Administered 2020-09-01: 15 mL

## 2020-09-01 MED ORDER — PROPOFOL 500 MG/50ML IV EMUL
INTRAVENOUS | Status: DC | PRN
  Administered 2020-09-01: 125 ug/kg/min via INTRAVENOUS

## 2020-09-01 MED ORDER — DROPERIDOL 2.5 MG/ML IJ SOLN
0.6250 mg | Freq: Once | INTRAMUSCULAR | Status: DC | PRN
  Filled 2020-09-01: qty 2

## 2020-09-01 MED ORDER — SUGAMMADEX SODIUM 200 MG/2ML IV SOLN
INTRAVENOUS | Status: DC | PRN
  Administered 2020-09-01: 162.6 mg via INTRAVENOUS

## 2020-09-01 MED ORDER — CHLORHEXIDINE GLUCONATE 0.12 % MT SOLN
15.0000 mL | Freq: Once | OROMUCOSAL | Status: AC
  Administered 2020-09-01: 15 mL via OROMUCOSAL

## 2020-09-01 MED ORDER — FAMOTIDINE 20 MG PO TABS
20.0000 mg | ORAL_TABLET | Freq: Once | ORAL | Status: AC
  Administered 2020-09-01: 20 mg via ORAL

## 2020-09-01 MED ORDER — DEXAMETHASONE SODIUM PHOSPHATE 10 MG/ML IJ SOLN
INTRAMUSCULAR | Status: AC
  Filled 2020-09-01: qty 1

## 2020-09-01 MED ORDER — APREPITANT 40 MG PO CAPS
40.0000 mg | ORAL_CAPSULE | Freq: Once | ORAL | Status: AC
  Administered 2020-09-01: 40 mg via ORAL

## 2020-09-01 MED ORDER — FENTANYL CITRATE (PF) 100 MCG/2ML IJ SOLN
INTRAMUSCULAR | Status: AC
  Filled 2020-09-01: qty 2

## 2020-09-01 MED ORDER — PROPOFOL 10 MG/ML IV BOLUS
INTRAVENOUS | Status: AC
  Filled 2020-09-01: qty 40

## 2020-09-01 MED ORDER — KETOROLAC TROMETHAMINE 30 MG/ML IJ SOLN
INTRAMUSCULAR | Status: AC
  Filled 2020-09-01: qty 1

## 2020-09-01 MED ORDER — KETOROLAC TROMETHAMINE 30 MG/ML IJ SOLN
INTRAMUSCULAR | Status: DC | PRN
  Administered 2020-09-01: 30 mg via INTRAVENOUS

## 2020-09-01 MED ORDER — LACTATED RINGERS IV SOLN
INTRAVENOUS | Status: DC
Start: 2020-09-01 — End: 2020-09-01

## 2020-09-01 MED ORDER — ONDANSETRON HCL 4 MG/2ML IJ SOLN: 4.0000 mg | Freq: Four times a day (QID) | INTRAMUSCULAR | Status: DC | PRN

## 2020-09-01 MED ORDER — ONDANSETRON HCL 4 MG/2ML IJ SOLN
INTRAMUSCULAR | Status: AC
  Filled 2020-09-01: qty 2

## 2020-09-01 MED ORDER — CEFAZOLIN SODIUM-DEXTROSE 2-4 GM/100ML-% IV SOLN
INTRAVENOUS | Status: AC
  Filled 2020-09-01: qty 100

## 2020-09-01 MED ORDER — HYDROMORPHONE HCL 1 MG/ML IJ SOLN: 0.2000 mg | INTRAMUSCULAR | Status: DC | PRN

## 2020-09-01 MED ORDER — FENTANYL CITRATE (PF) 100 MCG/2ML IJ SOLN
INTRAMUSCULAR | Status: DC | PRN
  Administered 2020-09-01 (×2): 50 ug via INTRAVENOUS

## 2020-09-01 MED ORDER — DEXAMETHASONE SODIUM PHOSPHATE 10 MG/ML IJ SOLN
INTRAMUSCULAR | Status: DC | PRN
  Administered 2020-09-01: 10 mg via INTRAVENOUS

## 2020-09-01 MED ORDER — PROPOFOL 10 MG/ML IV BOLUS
INTRAVENOUS | Status: DC | PRN
  Administered 2020-09-01: 150 mg via INTRAVENOUS

## 2020-09-01 MED ORDER — ACETAMINOPHEN 10 MG/ML IV SOLN
INTRAVENOUS | Status: DC | PRN
  Administered 2020-09-01: 1000 mg via INTRAVENOUS

## 2020-09-01 MED ORDER — FAMOTIDINE 20 MG PO TABS
ORAL_TABLET | ORAL | Status: AC
  Filled 2020-09-01: qty 1

## 2020-09-01 MED ORDER — ROCURONIUM BROMIDE 100 MG/10ML IV SOLN
INTRAVENOUS | Status: DC | PRN
  Administered 2020-09-01: 50 mg via INTRAVENOUS
  Administered 2020-09-01: 30 mg via INTRAVENOUS

## 2020-09-01 MED ORDER — IBUPROFEN 600 MG PO TABS: 600.0000 mg | ORAL_TABLET | Freq: Four times a day (QID) | ORAL | Status: DC

## 2020-09-01 MED ORDER — LIDOCAINE HCL (PF) 2 % IJ SOLN
INTRAMUSCULAR | Status: AC
  Filled 2020-09-01: qty 5

## 2020-09-01 MED ORDER — HYDROCODONE-ACETAMINOPHEN 5-325 MG PO TABS: 1.0000 | ORAL_TABLET | ORAL | 0 refills | Status: DC | PRN

## 2020-09-01 MED ORDER — ONDANSETRON HCL 4 MG PO TABS: 4.0000 mg | ORAL_TABLET | Freq: Four times a day (QID) | ORAL | Status: DC | PRN

## 2020-09-01 MED ORDER — PROPOFOL 500 MG/50ML IV EMUL
INTRAVENOUS | Status: AC
  Filled 2020-09-01: qty 50

## 2020-09-01 MED ORDER — FENTANYL CITRATE (PF) 100 MCG/2ML IJ SOLN
25.0000 ug | INTRAMUSCULAR | Status: DC | PRN
  Administered 2020-09-01: 25 ug via INTRAVENOUS

## 2020-09-01 MED ORDER — ROCURONIUM BROMIDE 10 MG/ML (PF) SYRINGE
PREFILLED_SYRINGE | INTRAVENOUS | Status: AC
  Filled 2020-09-01: qty 10

## 2020-09-01 MED ORDER — SILVER NITRATE-POT NITRATE 75-25 % EX MISC
CUTANEOUS | Status: AC
  Filled 2020-09-01: qty 10

## 2020-09-01 MED ORDER — ACETAMINOPHEN 10 MG/ML IV SOLN
INTRAVENOUS | Status: AC
  Filled 2020-09-01: qty 100

## 2020-09-01 MED ORDER — CEFAZOLIN SODIUM-DEXTROSE 2-4 GM/100ML-% IV SOLN
2.0000 g | INTRAVENOUS | Status: AC
  Administered 2020-09-01: 2 g via INTRAVENOUS

## 2020-09-01 MED ORDER — MIDAZOLAM HCL 2 MG/2ML IJ SOLN
INTRAMUSCULAR | Status: AC
  Filled 2020-09-01: qty 2

## 2020-09-01 MED ORDER — CHLORHEXIDINE GLUCONATE 0.12 % MT SOLN
OROMUCOSAL | Status: AC
  Filled 2020-09-01: qty 15

## 2020-09-01 MED ORDER — HYDROCODONE-ACETAMINOPHEN 5-325 MG PO TABS: 1.0000 | ORAL_TABLET | ORAL | Status: DC | PRN

## 2020-09-01 MED ORDER — KETOROLAC TROMETHAMINE 30 MG/ML IJ SOLN
30.0000 mg | Freq: Four times a day (QID) | INTRAMUSCULAR | Status: DC
  Administered 2020-09-01: 30 mg via INTRAVENOUS
  Filled 2020-09-01: qty 1

## 2020-09-01 MED ORDER — LIDOCAINE HCL (CARDIAC) PF 100 MG/5ML IV SOSY
PREFILLED_SYRINGE | INTRAVENOUS | Status: DC | PRN
  Administered 2020-09-01: 80 mg via INTRAVENOUS

## 2020-09-01 MED ORDER — LACTATED RINGERS IV SOLN: INTRAVENOUS | Status: DC | PRN

## 2020-09-01 MED ORDER — ONDANSETRON HCL 4 MG/2ML IJ SOLN
INTRAMUSCULAR | Status: DC | PRN
  Administered 2020-09-01: 4 mg via INTRAVENOUS

## 2020-09-01 MED ORDER — SUCCINYLCHOLINE CHLORIDE 200 MG/10ML IV SOSY
PREFILLED_SYRINGE | INTRAVENOUS | Status: AC
  Filled 2020-09-01: qty 10

## 2020-09-01 MED ORDER — APREPITANT 40 MG PO CAPS
ORAL_CAPSULE | ORAL | Status: AC
  Filled 2020-09-01: qty 1

## 2020-09-01 MED ORDER — HYDROMORPHONE HCL 2 MG PO TABS: 2.0000 mg | ORAL_TABLET | Freq: Once | ORAL | Status: DC | PRN

## 2020-09-01 MED ORDER — MIDAZOLAM HCL 2 MG/2ML IJ SOLN
INTRAMUSCULAR | Status: DC | PRN
  Administered 2020-09-01: 1 mg via INTRAVENOUS

## 2020-09-01 MED ORDER — DEXTROSE-NACL 5-0.45 % IV SOLN: INTRAVENOUS | Status: DC

## 2020-09-01 MED ORDER — SIMETHICONE 80 MG PO CHEW: 80.0000 mg | CHEWABLE_TABLET | Freq: Four times a day (QID) | ORAL | Status: DC | PRN

## 2020-09-01 SURGICAL SUPPLY — 54 items
APPLICATOR ARISTA FLEXITIP XL (MISCELLANEOUS) IMPLANT
BACTOSHIELD CHG 4% 4OZ (MISCELLANEOUS) ×1
BAG URINE DRAIN 2000ML AR STRL (UROLOGICAL SUPPLIES) ×2 IMPLANT
BLADE SURG SZ11 CARB STEEL (BLADE) ×2 IMPLANT
CATH FOLEY 2WAY  5CC 16FR (CATHETERS) ×1
CATH URTH 16FR FL 2W BLN LF (CATHETERS) ×1 IMPLANT
CHLORAPREP W/TINT 26 (MISCELLANEOUS) IMPLANT
DEFOGGER SCOPE WARMER CLEARIFY (MISCELLANEOUS) IMPLANT
DERMABOND ADVANCED (GAUZE/BANDAGES/DRESSINGS) ×1
DERMABOND ADVANCED .7 DNX12 (GAUZE/BANDAGES/DRESSINGS) ×1 IMPLANT
DEVICE SUTURE ENDOST 10MM (ENDOMECHANICALS) ×2 IMPLANT
GAUZE 4X4 16PLY ~~LOC~~+RFID DBL (SPONGE) ×4 IMPLANT
GLOVE SURG ENC MOIS LTX SZ7 (GLOVE) ×8 IMPLANT
GLOVE SURG UNDER LTX SZ7.5 (GLOVE) ×2 IMPLANT
GOWN STRL REUS W/ TWL LRG LVL3 (GOWN DISPOSABLE) ×2 IMPLANT
GOWN STRL REUS W/ TWL XL LVL3 (GOWN DISPOSABLE) ×1 IMPLANT
GOWN STRL REUS W/TWL LRG LVL3 (GOWN DISPOSABLE) ×2
GOWN STRL REUS W/TWL XL LVL3 (GOWN DISPOSABLE) ×1
GRASPER SUT TROCAR 14GX15 (MISCELLANEOUS) ×2 IMPLANT
HEMOSTAT ARISTA ABSORB 3G PWDR (HEMOSTASIS) IMPLANT
HOLDER FOLEY CATH W/STRAP (MISCELLANEOUS) ×2 IMPLANT
IRRIGATION STRYKERFLOW (MISCELLANEOUS) ×1 IMPLANT
IRRIGATOR STRYKERFLOW (MISCELLANEOUS) ×2
IV LACTATED RINGERS 1000ML (IV SOLUTION) IMPLANT
IV NS 1000ML (IV SOLUTION) ×1
IV NS 1000ML BAXH (IV SOLUTION) ×1 IMPLANT
KIT PINK PAD W/HEAD ARE REST (MISCELLANEOUS) ×2
KIT PINK PAD W/HEAD ARM REST (MISCELLANEOUS) ×1 IMPLANT
LABEL OR SOLS (LABEL) ×2 IMPLANT
MANIFOLD NEPTUNE II (INSTRUMENTS) ×2 IMPLANT
MANIPULATOR VCARE LG CRV RETR (MISCELLANEOUS) ×2 IMPLANT
MANIPULATOR VCARE SML CRV RETR (MISCELLANEOUS) IMPLANT
MANIPULATOR VCARE STD CRV RETR (MISCELLANEOUS) IMPLANT
NS IRRIG 500ML POUR BTL (IV SOLUTION) ×2 IMPLANT
OCCLUDER COLPOPNEUMO (BALLOONS) IMPLANT
PACK GYN LAPAROSCOPIC (MISCELLANEOUS) ×2 IMPLANT
PAD OB MATERNITY 4.3X12.25 (PERSONAL CARE ITEMS) IMPLANT
PAD PREP 24X41 OB/GYN DISP (PERSONAL CARE ITEMS) ×2 IMPLANT
SCISSORS METZENBAUM CVD 33 (INSTRUMENTS) ×2 IMPLANT
SCRUB CHG 4% DYNA-HEX 4OZ (MISCELLANEOUS) ×1 IMPLANT
SET CYSTO W/LG BORE CLAMP LF (SET/KITS/TRAYS/PACK) ×2 IMPLANT
SHEARS HARMONIC ACE PLUS 36CM (ENDOMECHANICALS) ×2 IMPLANT
SLEEVE ENDOPATH XCEL 5M (ENDOMECHANICALS) ×2 IMPLANT
STRAP SAFETY 5IN WIDE (MISCELLANEOUS) ×2 IMPLANT
SUT ENDO VLOC 180-0-8IN (SUTURE) ×2 IMPLANT
SUT MNCRL 4-0 (SUTURE) ×2
SUT MNCRL 4-0 27XMFL (SUTURE) ×2
SUT VIC AB 0 CT1 36 (SUTURE) ×2 IMPLANT
SUTURE MNCRL 4-0 27XMF (SUTURE) ×2 IMPLANT
SYR 10ML LL (SYRINGE) ×2 IMPLANT
SYR 50ML LL SCALE MARK (SYRINGE) ×2 IMPLANT
TROCAR ENDO BLADELESS 11MM (ENDOMECHANICALS) ×2 IMPLANT
TROCAR XCEL NON-BLD 5MMX100MML (ENDOMECHANICALS) ×2 IMPLANT
TUBING EVAC SMOKE HEATED PNEUM (TUBING) ×2 IMPLANT

## 2020-09-01 NOTE — Discharge Summary (Signed)
Physician Discharge Summary  Patient ID: Suzanne Stout MRN: [id number] DOB/AGE: 23-Aug-1975 45 y.o.  Admit date: 09/01/2020 Discharge date: 09/01/2020  Admission Diagnoses:  Discharge Diagnoses:  Active Problems:   S/P laparoscopic hysterectomy   Discharged Condition: good  Hospital Course: Patient admitted for scheduled TLH, cystoscopy for uterine fibroid.  Procedure uncomplicated.  Initial plan had been for overnight observation but patient was feeling well and meeting all postoperative goals and opted for discharge evening of POD0.  Consults: None  Significant Diagnostic Studies: none  Treatments: TLH, BS  Discharge Exam: Blood pressure 109/72, pulse (!) 56, temperature 98 F (36.7 C), temperature source Oral, resp. rate 20, height '5\' 5"'$  (1.651 m), weight 81.3 kg, SpO2 99 %. General appearance: alert, cooperative, and appears stated age Resp: no increased work of breathing Extremities: extremities normal, atraumatic, no cyanosis or edema Incision/Wound: D/C/I  Disposition: Discharge disposition: 01-Home or Self Care       Discharge Instructions     Call MD for:   Complete by: As directed    Heavy vaginal bleeding greater than 1 pad an hour   Call MD for:  difficulty breathing, headache or visual disturbances   Complete by: As directed    Call MD for:  extreme fatigue   Complete by: As directed    Call MD for:  hives   Complete by: As directed    Call MD for:  persistant dizziness or light-headedness   Complete by: As directed    Call MD for:  persistant nausea and vomiting   Complete by: As directed    Call MD for:  redness, tenderness, or signs of infection (pain, swelling, redness, odor or green/yellow discharge around incision site)   Complete by: As directed    Call MD for:  severe uncontrolled pain   Complete by: As directed    Call MD for:  temperature >100.4   Complete by: As directed    Diet general   Complete by: As directed    Discharge  wound care:   Complete by: As directed    You may apply a light dressing for minor discharge from the incision or to keep waistbands of clothing from rubbing. You may shower, use soap on your incision.  Avoid baths or soaking the incision in the first 6 weeks following your surgery.   Driving restriction   Complete by: As directed    Avoid driving for at least 2 weeks or while taking prescription narcotics.   Lifting restrictions   Complete by: As directed    Weight restriction of 10lbs for 6 weeks.      Allergies as of 09/01/2020       Reactions   Latex Dermatitis, Rash        Medication List     TAKE these medications    HYDROcodone-acetaminophen 5-325 MG tablet Commonly known as: NORCO/VICODIN Take 1-2 tablets by mouth every 4 (four) hours as needed for moderate pain.   ibuprofen 600 MG tablet Commonly known as: ADVIL Take 1 tablet (600 mg total) by mouth every 6 (six) hours. Start taking on: September 02, 2020   lisinopril-hydrochlorothiazide 10-12.5 MG tablet Commonly known as: ZESTORETIC Take 1 tablet by mouth every evening.   Melatonin 10 MG Tabs Take 10 mg by mouth at bedtime as needed (sleep.). Gummies   rOPINIRole 0.25 MG tablet Commonly known as: REQUIP Take 0.25 mg by mouth at bedtime.  Discharge Care Instructions  (From admission, onward)           Start     Ordered   09/01/20 0000  Discharge wound care:       Comments: You may apply a light dressing for minor discharge from the incision or to keep waistbands of clothing from rubbing. You may shower, use soap on your incision.  Avoid baths or soaking the incision in the first 6 weeks following your surgery.   09/01/20 1740            Follow-up Information     Malachy Mood, MD Follow up in 1 week(s).   Specialty: Obstetrics and Gynecology Why: For wound re-check Contact information: [address] [place] [address] [phone]                  Signed: Malachy Mood 09/01/2020, 5:41 PM

## 2020-09-01 NOTE — Anesthesia Preprocedure Evaluation (Signed)
Anesthesia Evaluation  Patient identified by MRN, date of birth, ID band Patient awake    Reviewed: Allergy & Precautions, NPO status , Patient's Chart, lab work & pertinent test results  History of Anesthesia Complications (+) PONV and history of anesthetic complications  Airway Mallampati: I  TM Distance: >3 FB Neck ROM: Full   Comment: Newly placed nose ring present in left nare. Dental no notable dental hx. (+) Teeth Intact   Pulmonary neg pulmonary ROS, neg sleep apnea, neg COPD, Patient abstained from smoking.Not current smoker,    Pulmonary exam normal breath sounds clear to auscultation       Cardiovascular Exercise Tolerance: Good METShypertension, Pt. on medications (-) CAD and (-) Past MI (-) dysrhythmias  Rhythm:Regular Rate:Normal - Systolic murmurs    Neuro/Psych PSYCHIATRIC DISORDERS Anxiety negative neurological ROS     GI/Hepatic PUD, neg GERD  ,(+)     (-) substance abuse  ,   Endo/Other  neg diabetes  Renal/GU negative Renal ROS     Musculoskeletal   Abdominal   Peds  Hematology   Anesthesia Other Findings Past Medical History: No date: Anxiety [id number]: Complication of anesthesia     Comment:  had a hard time waking  No date: Hypertension No date: Psoriasis No date: RLS (restless legs syndrome)  Reproductive/Obstetrics                             Anesthesia Physical Anesthesia Plan  ASA: 2  Anesthesia Plan: General   Post-op Pain Management:    Induction: Intravenous  PONV Risk Score and Plan: 4 or greater and Ondansetron, Dexamethasone, Midazolam, TIVA, Propofol infusion and Aprepitant  Airway Management Planned: Oral ETT  Additional Equipment: None  Intra-op Plan:   Post-operative Plan: Extubation in OR  Informed Consent: I have reviewed the patients History and Physical, chart, labs and discussed the procedure including the risks, benefits and  alternatives for the proposed anesthesia with the patient or authorized representative who has indicated his/her understanding and acceptance.     Dental advisory given  Plan Discussed with: CRNA and Surgeon  Anesthesia Plan Comments: (Discussed risks of anesthesia with patient, including PONV, sore throat, lip/dental damage. Discussed possibility of nosering being caught on drapes or conducting electric current, patient accepts the risks. Rare risks discussed as well, such as cardiorespiratory and neurological sequelae, and allergic reactions. Patient understands. Patient states she gets extreme itching with oxycodone; discussed other possible post operative narcotic options such as tramadol or dilaudid.)        Anesthesia Quick Evaluation

## 2020-09-01 NOTE — Progress Notes (Signed)
Patient discharged home. Discharge instructions, prescriptions and follow up appointment given to and reviewed with patient. Patient verbalized understanding.

## 2020-09-01 NOTE — Interval H&P Note (Signed)
History and Physical Interval Note:  09/01/2020 7:26 AM  Suzanne Stout  has presented today for surgery, with the diagnosis of intremural fibroid.  The various methods of treatment have been discussed with the patient and family. After consideration of risks, benefits and other options for treatment, the patient has consented to  Procedure(s): HYSTERECTOMY TOTAL LAPAROSCOPIC (N/A) as a surgical intervention.  The patient's history has been reviewed, patient examined, no change in status, stable for surgery.  I have reviewed the patient's chart and labs.  Questions were answered to the patient's satisfaction.     Malachy Mood

## 2020-09-01 NOTE — Transfer of Care (Signed)
Immediate Anesthesia Transfer of Care Note  Patient: Suzanne Stout  Procedure(s) Performed: HYSTERECTOMY TOTAL LAPAROSCOPIC REMOVAL OF IUD AND CYSTOSCOPY  Patient Location: PACU  Anesthesia Type:General  Level of Consciousness: awake and alert   Airway & Oxygen Therapy: Patient Spontanous Breathing and Patient connected to face mask oxygen  Post-op Assessment: Report given to RN and Post -op Vital signs reviewed and stable  Post vital signs: Reviewed and stable  Last Vitals:  Vitals Value Taken Time  BP 88/53 09/01/20 0945  Temp 36.1 C 09/01/20 0944  Pulse 88 09/01/20 0948  Resp 21 09/01/20 0948  SpO2 100 % 09/01/20 0948  Vitals shown include unvalidated device data.  Last Pain:  Vitals:   09/01/20 0944  TempSrc:   PainSc: 0-No pain         Complications: No notable events documented.

## 2020-09-01 NOTE — Op Note (Signed)
Preoperative Diagnosis: 1) 45 y.o. with uterine fibroids  Postoperative Diagnosis: 1) 45 y.o. with uterine fibroids  Operation Performed: Total laparoscopic hysterectomy, and cystoscopy  Indication: Uterine fibroids  Surgeon: Malachy Mood, MD  Assistant: Adrian Prows, MD this surgery required a high level surgical assistant with none other readily available.  Anesthesia: General  Preoperative Antibiotics: none  Estimated Blood Loss: 50 mL  IV Fluids: 867m  Urine Output:: 1018m Drains or Tubes: none  Implants: none  Specimens Removed: Uterus, cervix  Complications: none  Intraoperative Findings: Uterus with large 5cm fundal fibroid, normal ovaries, absent tubes.  Small let ovarian corpus luteum cyst  Patient Condition: stable  Procedure in Detail:  Patient was taken to the operating room where she was administered general anesthesia.  She was positioned in the dorsal lithotomy position utilizing Allen stirups, prepped and draped in the usual sterile fashion.  Prior to proceeding with procedure a time out was performed.  Attention was turned to the patient's pelvis.  An indwelling foley catheter was placed to decompress the patient's bladder.  An operative speculum was placed to allow visualization of the cervix.  The anterior lip of the cervix was grasped with a single tooth tenaculum, and a large V-care uterine manipulator was placed to allow manipulation of the uterus.  The operative speculum and single tooth tenaculum were then removed.  Attention was turned to the patient's abdomen.  The umbilicus was infiltrated with 1% Sensorcaine, before making a stab incision using an 11 blade scalpel.  A 65m22mxcel trocar was then used to gain direct entry into the peritoneal cavity utilizing the camera to visualize progress of the trocar during placement.  Once peritoneal entry had been achieved, insufflation was started and pneumoperitoneum established at a pressure of  165m40m    One left and one right lower quadrant site were then injected with 1% Sensorcaine and a stab incision was made using an 11 blade scalpel.  Two additional 65mm 44mel trocars were placed through these incisions under direct visualization. The umbilical trocar incision was stepped up to an 11mm 52ml trocar.  General inspection of the abdomen revealed the above noted findings.    The left utero ovarian ligament was identified ligated and transected using the Harmonic scalpel. The round ligament was then likewise ligated and transected.  The anterior leaf of the broad ligament was dissected down to the level of the internal cervical os and a bladder flap was started.  The posterior leaf of the broad ligament was dissected down to the utero-sacral ligament.  The uterine artery was skeletonized before being ligated and transected using the Harmonic scalpel with cephelad pressure applied to the V-care device to assure lateralization of the ureter.  A bite was then taken with Harmonic medial to transected portio of uterine artery to further lateralize the ureter and vessel off the V-care cup.  The patient right adnexal structures were then dissected in similar fashion by Dr. SchumaGilman Schmidt bladder flap was completed and the bladder mobilized off the V-care cup.  An anterior colpotomy was scores and carried around in a clockwise fashion to free the specimen, which was then removed vaginally.  Inspection revealed all pedicles to be hemostatic before proceed with vaginal closure. The cuff was closed using an Endostitch device with a 0 V-loc load.  The cuff was hemsotatic at the conclusion of closure without visible or palpable defects.  The 11mm t23m site was closed using with Carter-Thompson and 0 Vicryl.  Pneumoperitoneum was evacuated  and trocars were removed.  All trocar sites were then dressed with surgical skin glue.  The indwelling foley catheter was removed.  Cystoscopy was performed noting and intact  bladder dome as well as brisk efflux of urine from bother ureteral orifices.  The cystoscopy was removed and the indwelling foley catheter was replaced.Sponge needle and instrument counts were correct time two.  The patient tolerated the procedure well and was taken to the recovery room in stable condition.

## 2020-09-01 NOTE — Anesthesia Procedure Notes (Signed)
Procedure Name: Intubation Date/Time: 09/01/2020 7:40 AM Performed by: Demetrius Charity, CRNA Pre-anesthesia Checklist: Patient identified, Patient being monitored, Timeout performed, Emergency Drugs available and Suction available Patient Re-evaluated:Patient Re-evaluated prior to induction Oxygen Delivery Method: Circle system utilized Preoxygenation: Pre-oxygenation with 100% oxygen Induction Type: IV induction Ventilation: Mask ventilation without difficulty Laryngoscope Size: 3 and McGraph Grade View: Grade I Tube type: Oral Tube size: 6.5 mm Number of attempts: 1 Airway Equipment and Method: Stylet and Video-laryngoscopy Placement Confirmation: ETT inserted through vocal cords under direct vision, positive ETCO2 and breath sounds checked- equal and bilateral Secured at: 20 cm Tube secured with: Tape Dental Injury: Teeth and Oropharynx as per pre-operative assessment

## 2020-09-01 NOTE — Anesthesia Postprocedure Evaluation (Signed)
Anesthesia Post Note  Patient: [name]  Procedure(s) Performed: HYSTERECTOMY TOTAL LAPAROSCOPIC REMOVAL OF IUD AND CYSTOSCOPY  Patient location during evaluation: PACU Anesthesia Type: General Level of consciousness: awake and alert Pain management: pain level controlled Vital Signs Assessment: post-procedure vital signs reviewed and stable Respiratory status: spontaneous breathing, nonlabored ventilation, respiratory function stable and patient connected to nasal cannula oxygen Cardiovascular status: blood pressure returned to baseline and stable Postop Assessment: no apparent nausea or vomiting Anesthetic complications: no   No notable events documented.   Last Vitals:  Vitals:   09/01/20 1027 09/01/20 1114  BP:  113/75  Pulse: 68 (!) 59  Resp: 18 18  Temp: (!) 36.1 C 36.7 C  SpO2: 98% 99%    Last Pain:  Vitals:   09/01/20 1114  TempSrc: Oral  PainSc:                  Arita Miss

## 2020-09-02 ENCOUNTER — Encounter: Payer: Self-pay | Admitting: Obstetrics and Gynecology

## 2020-09-05 LAB — SURGICAL PATHOLOGY

## 2020-09-06 ENCOUNTER — Other Ambulatory Visit: Payer: Self-pay

## 2020-09-06 ENCOUNTER — Ambulatory Visit (INDEPENDENT_AMBULATORY_CARE_PROVIDER_SITE_OTHER): Payer: BC Managed Care – PPO | Admitting: Obstetrics and Gynecology

## 2020-09-06 DIAGNOSIS — Z4889 Encounter for other specified surgical aftercare: Secondary | ICD-10-CM

## 2020-09-13 NOTE — Progress Notes (Signed)
Postoperative Follow-up Patient presents post op from [facility], cystoscopy 1weeks ago for  uterine fibroids .  Subjective: Patient reports marked improvement in her preop symptoms. Eating a regular diet without difficulty. Pain is controlled without any medications.  Activity: normal activities of daily living.  Objective: There were no vitals taken for this visit.  General: NAD Pulmonary: no increased work of breathing Abdomen: soft, non-tender, non-distended, incision(s) D/C/I Extremities: no edema Neurologic: normal gait   Admission on 09/01/2020, Discharged on 09/01/2020  Component Date Value Ref Range Status   ABO/RH(D) 09/01/2020    Final                   Value:A NEG Performed at [facility], [address] [place], [place] [address]    Preg Test, Ur 09/01/2020 NEGATIVE  NEGATIVE Final   Comment:        THE SENSITIVITY OF THIS METHODOLOGY IS >24 mIU/mL    SURGICAL PATHOLOGY 09/01/2020    Final-Edited                   Value:SURGICAL PATHOLOGY CASE: [id number] PATIENT: Suzanne Stout Surgical Pathology Report     Specimen Submitted: A. Uterus, cervix  Clinical History: Intramural fibroid      DIAGNOSIS: A. UTERUS WITH CERVIX; TOTAL HYSTERECTOMY: - CHRONIC CERVICITIS. - ENDOMETRIUM WITH DECIDUALIZED STROMA, CONSISTENT WITH PROGESTIN EFFECT. - SEROSA WITH BENIGN MESOTHELIAL INCLUSION CYST. 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 - NEGATIVE FOR ATYPIA AND MALIGNANCY.  Comment: The leiomyoma demonstrates some variant histologic features that are favored to represent progestin effect.  Malignancy is not identified.  GROSS DESCRIPTION: A. Labeled: Uterus, cervix Received: Formalin Collection time: 8:50 AM on 09/01/2020 Placed into formalin time: 9:15 AM on 09/01/2020 Weight: 123 grams Dimensions:      Fundus -9.7 (superior to inferior) x 6.4 (breadth of uterus at fundus) x 4.9 (anterior to posterior)  cm      Cervix -4.5 x 4.4 cm Serosa: The                          serosa is tan-pink, smooth, and glistening with multiple unilocular cysts on the posterior body.  The cysts range from 0.1 to 0.5 cm in greatest dimension. Cervix: There is a thin peripheral rim of pink, smooth, and pearly cervix. Endocervix: The endocervix is red-tan, mucoid, and finely granular with a 1.4 cm slitlike endocervical os.  Within the wall there are multiple unilocular cysts which contain clear gelatinous contents, ranging from 0.2 to 0.4 cm in greatest dimension. Endometrial cavity:      Dimensions -3.9 (superior to inferior) x 2.4 (cornu to cornu) cm      Thickness -0.1 cm      Other findings -the endometrium is tan and finely granular. Myometrium:     Thickness -2.4 cm     Other findings -the myometrium is tan-pink with scattered areas of trabeculation.  There is a tan, whorled, firm, and well-circumscribed intramural nodule, 3.4 x 3.1 x 2.7 cm. Other comments: Due to the shape of the specimen the anterior and posterior aspects are presumed based on t                         he peritoneal reflection.  Block summary: 1 - representative presumed anterior cervix/endocervix 2 - representative presumed posterior cervix/endocervix 3 - representative presumed anterior transmural endomyometrium 4 -  representative presumed posterior transmural endomyometrium 5 - representative serosal cysts 6 - representative intramural nodule (1/cm) 7 - representative trabeculated myometrium 8 - 9 - additional sections of intramural nodule  RB 09/01/2020  Final Diagnosis performed by Quay Burow, MD.   Electronically signed 09/05/2020 3:48:57PM The electronic signature indicates that the named Attending Pathologist has evaluated the specimen Technical component performed at [facility], [address] [place], [place] [address] Lab: [phone] Dir: Rush Farmer, MD, MMM  Professional component performed at [facility],  [facility], [place], [place], [place] [address] Lab: [phone] Dir: Dellia Nims. Rubinas, MD     Assessment: 45 y.o. s/p TLH, BS stable  Plan: Patient has done well after surgery with no apparent complications.  I have discussed the post-operative course to date, and the expected progress moving forward.  The patient understands what complications to be concerned about.  I will see the patient in routine follow up, or sooner if needed.    Activity plan: No heavy lifting.  Return in about 5 weeks (around 10/11/2020) for postop.    Malachy Mood, MD, [facility], [place]

## 2020-10-11 ENCOUNTER — Other Ambulatory Visit: Payer: Self-pay

## 2020-10-11 ENCOUNTER — Encounter: Payer: Self-pay | Admitting: Obstetrics and Gynecology

## 2020-10-11 ENCOUNTER — Ambulatory Visit (INDEPENDENT_AMBULATORY_CARE_PROVIDER_SITE_OTHER): Payer: BC Managed Care – PPO | Admitting: Obstetrics and Gynecology

## 2020-10-11 VITALS — BP 120/72 | Wt 173.0 lb

## 2020-10-11 DIAGNOSIS — N951 Menopausal and female climacteric states: Secondary | ICD-10-CM

## 2020-10-11 DIAGNOSIS — Z4889 Encounter for other specified surgical aftercare: Secondary | ICD-10-CM

## 2020-10-11 MED ORDER — GABAPENTIN 100 MG PO CAPS: ORAL_CAPSULE | ORAL | 0 refills | Status: DC

## 2020-10-11 NOTE — Progress Notes (Signed)
Postoperative Follow-up Patient presents post op from [facility], [facility], cystoscopy 6weeks ago for abnormal uterine bleeding.  Subjective: Patient reports marked improvement in her preop symptoms. Eating a regular diet without difficulty. The patient is not having any pain.  Activity: normal activities of daily living.  Objective: Blood pressure 120/72, weight 173 lb (78.5 kg).  General: NAD Pulmonary: no increased work of breathing Abdomen: soft, non-tender, non-distended, incision(s) D/C/I GU: normal external female genitalia vaginal cuff intact, well healed Extremities: no edema Neurologic: normal gait   Admission on 09/01/2020, Discharged on 09/01/2020  Component Date Value Ref Range Status   ABO/RH(D) 09/01/2020    Final                   Value:A NEG Performed at [facility], [address] [place], [place] [address]    Preg Test, Ur 09/01/2020 NEGATIVE  NEGATIVE Final   Comment:        THE SENSITIVITY OF THIS METHODOLOGY IS >24 mIU/mL    SURGICAL PATHOLOGY 09/01/2020    Final-Edited                   Value:SURGICAL PATHOLOGY CASE: [id number] PATIENT: Chevis Pretty Surgical Pathology Report     Specimen Submitted: A. Uterus, cervix  Clinical History: Intramural fibroid      DIAGNOSIS: A. UTERUS WITH CERVIX; TOTAL HYSTERECTOMY: - CHRONIC CERVICITIS. - ENDOMETRIUM WITH DECIDUALIZED STROMA, CONSISTENT WITH PROGESTIN EFFECT. - SEROSA WITH BENIGN MESOTHELIAL INCLUSION CYST. 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 - NEGATIVE FOR ATYPIA AND MALIGNANCY.  Comment: The leiomyoma demonstrates some variant histologic features that are favored to represent progestin effect.  Malignancy is not identified.  GROSS DESCRIPTION: A. Labeled: Uterus, cervix Received: Formalin Collection time: 8:50 AM on 09/01/2020 Placed into formalin time: 9:15 AM on 09/01/2020 Weight: 123 grams Dimensions:      Fundus -9.7 (superior to  inferior) x 6.4 (breadth of uterus at fundus) x 4.9 (anterior to posterior) cm      Cervix -4.5 x 4.4 cm Serosa: The                          serosa is tan-pink, smooth, and glistening with multiple unilocular cysts on the posterior body.  The cysts range from 0.1 to 0.5 cm in greatest dimension. Cervix: There is a thin peripheral rim of pink, smooth, and pearly cervix. Endocervix: The endocervix is red-tan, mucoid, and finely granular with a 1.4 cm slitlike endocervical os.  Within the wall there are multiple unilocular cysts which contain clear gelatinous contents, ranging from 0.2 to 0.4 cm in greatest dimension. Endometrial cavity:      Dimensions -3.9 (superior to inferior) x 2.4 (cornu to cornu) cm      Thickness -0.1 cm      Other findings -the endometrium is tan and finely granular. Myometrium:     Thickness -2.4 cm     Other findings -the myometrium is tan-pink with scattered areas of trabeculation.  There is a tan, whorled, firm, and well-circumscribed intramural nodule, 3.4 x 3.1 x 2.7 cm. Other comments: Due to the shape of the specimen the anterior and posterior aspects are presumed based on t                         he peritoneal reflection.  Block summary: 1 - representative presumed anterior cervix/endocervix 2 - representative presumed  posterior cervix/endocervix 3 - representative presumed anterior transmural endomyometrium 4 - representative presumed posterior transmural endomyometrium 5 - representative serosal cysts 6 - representative intramural nodule (1/cm) 7 - representative trabeculated myometrium 8 - 9 - additional sections of intramural nodule  RB 09/01/2020  Final Diagnosis performed by Quay Burow, MD.   Electronically signed 09/05/2020 3:48:57PM The electronic signature indicates that the named Attending Pathologist has evaluated the specimen Technical component performed at [facility], [address] [place], [place] [address] Lab: [phone] Dir:  Rush Farmer, MD, MMM  Professional component performed at [facility], [facility], [place], [place],  [address] Lab: [phone] Dir: Dellia Nims. Rubinas, MD     Assessment: 45 y.o. s/p TLH, BS, cystoscopy stable  Plan: Patient has done well after surgery with no apparent complications.  I have discussed the post-operative course to date, and the expected progress moving forward.  The patient understands what complications to be concerned about.  I will see the patient in routine follow up, or sooner if needed.    Activity plan: No restriction.  Vasomotor symptoms - Rx neurontin  Return in about 6 weeks (around 11/22/2020) for medication follow up phone or in person.    Malachy Mood, MD, [facility], [facility] 10/11/2020, 2:31 PM

## 2021-10-18 ENCOUNTER — Ambulatory Visit
Admission: RE | Admit: 2021-10-18 | Discharge: 2021-10-18 | Disposition: A | Discharge status: Home / Self Care | Payer: BC Managed Care – PPO | Source: Ambulatory Visit

## 2021-10-18 VITALS — BP 113/78 | HR 101 | Temp 98.7°F | Resp 16

## 2021-10-18 DIAGNOSIS — R1012 Left upper quadrant pain: Secondary | ICD-10-CM | POA: Diagnosis not present

## 2021-10-18 NOTE — ED Notes (Signed)
Patient is being discharged from the Urgent Care and sent to the Emergency Department via POV . Per Annie Main Immordino, patient is in need of higher level of care due to Providers assesment. Patient is aware and verbalizes understanding of plan of care.  Vitals:   10/18/21 0846  BP: 113/78  Pulse: (!) 101  Resp: 16  Temp: 98.7 F (37.1 C)  SpO2: 98%

## 2021-10-18 NOTE — ED Provider Notes (Addendum)
[name]    CSN: [id number] Arrival date & time: 10/18/21  8242      History   Chief Complaint Chief Complaint  Patient presents with   Abdominal Pain    Severe pain under left breast, nauseous. Diagnosed with stomach ulcers in past and inflamed gallbladder. - Entered by patient    HPI Suzanne Stout is a 46 y.o. female.    Abdominal Pain   Patient presents to urgent care with complaint of acute left upper quadrant pain.  She states the pain is under her left breast and reports black/bloody stools.  Also reports nausea x10 days.  Patient reports history of gastric ulcers and "inflamed gallbladder".  Her chart shows hospital encounter at [facility], 01/17/2016, for "kissing ulcers" in the duodenal bulb.    Past Medical History:  Diagnosis Date   Anxiety    Complication of anesthesia 2003   had a hard time waking    Hypertension    Psoriasis    RLS (restless legs syndrome)     Patient Active Problem List   Diagnosis Date Noted   S/P laparoscopic hysterectomy 09/01/2020   Abnormal uterine bleeding (AUB)    Intramural leiomyoma of uterus    Unwanted fertility    Abdominal pain 01/17/2016   Gastric distention 01/17/2016   HTN (hypertension) 01/17/2016   RLS (restless legs syndrome) 01/17/2016    Past Surgical History:  Procedure Laterality Date   BREAST LUMPECTOMY Right 2002   ESOPHAGOGASTRODUODENOSCOPY (EGD) WITH PROPOFOL N/A 01/19/2016   Procedure: ESOPHAGOGASTRODUODENOSCOPY (EGD) WITH PROPOFOL;  Surgeon: Jonathon Bellows, MD;  Location: [facility] ENDOSCOPY;  Service: Endoscopy;  Laterality: N/A;   LAPAROSCOPIC BILATERAL SALPINGECTOMY Bilateral 04/12/2020   Procedure: LAPAROSCOPIC BILATERAL SALPINGECTOMY; IUD INSERTION;  Surgeon: Malachy Mood, MD;  Location: [facility] ORS;  Service: Gynecology;  Laterality: Bilateral;   LAPAROSCOPIC HYSTERECTOMY N/A 09/01/2020   Procedure: HYSTERECTOMY TOTAL LAPAROSCOPIC REMOVAL OF IUD AND CYSTOSCOPY;  Surgeon: Malachy Mood,  MD;  Location: [facility] ORS;  Service: Gynecology;  Laterality: N/A;   VAGINAL HYSTERECTOMY      OB History     Gravida  1   Para  1   Term  1   Preterm      AB      Living  1      SAB      IAB      Ectopic      Multiple      Live Births               Home Medications    Prior to Admission medications   Medication Sig Start Date End Date Taking? Authorizing Provider  gabapentin (NEURONTIN) 100 MG capsule 1 tablet po once daily on day 1, 1 tablet po bid day 2, then 1 tablet po tid 10/11/20   Malachy Mood, MD  lisinopril-hydrochlorothiazide (ZESTORETIC) 10-12.5 MG tablet Take 1 tablet by mouth every evening.    [provider]  Melatonin 10 MG TABS Take 10 mg by mouth at bedtime as needed (sleep.). Gummies    [provider]  rOPINIRole (REQUIP) 0.25 MG tablet Take 0.25 mg by mouth at bedtime.    [provider]  Vitamin D, Ergocalciferol, (DRISDOL) 1.25 MG (50000 UNIT) CAPS capsule Take 50,000 Units by mouth once a week. 10/10/21   [provider]    Family History Family History  Problem Relation Age of Onset   Gallbladder disease Other    Hypertension Mother    Alcoholism Father  Social History Social History   Tobacco Use   Smoking status: Never   Smokeless tobacco: Never  Vaping Use   Vaping Use: Never used  Substance Use Topics   Alcohol use: No   Drug use: Never     Allergies   Latex   Review of Systems Review of Systems  Gastrointestinal:  Positive for abdominal pain.     Physical Exam Triage Vital Signs ED Triage Vitals [10/18/21 0846]  Enc Vitals Group     BP 113/78     Pulse Rate (!) 101     Resp 16     Temp 98.7 F (37.1 C)     Temp Source Oral     SpO2 98 %     Weight      Height      Head Circumference      Peak Flow      Pain Score      Pain Loc      Pain Edu?      Excl. in [place]?    No data found.  Updated Vital Signs BP 113/78 (BP Location: Left Arm)   Pulse (!) 101    Temp 98.7 F (37.1 C) (Oral)   Resp 16   SpO2 98%   Visual Acuity Right Eye Distance:   Left Eye Distance:   Bilateral Distance:    Right Eye Near:   Left Eye Near:    Bilateral Near:     Physical Exam Vitals reviewed.  Constitutional:      General: She is in acute distress.  Neurological:     General: No focal deficit present.     Mental Status: She is alert and oriented to person, place, and time.  Psychiatric:        Mood and Affect: Mood normal.        Behavior: Behavior normal.      UC Treatments / Results  Labs (all labs ordered are listed, but only abnormal results are displayed) Labs Reviewed - No data to display  EKG   Radiology No results found.  Procedures Procedures (including critical care time)  Medications Ordered in UC Medications - No data to display  Initial Impression / Assessment and Plan / UC Course  I have reviewed the triage vital signs and the nursing notes.  Pertinent labs & imaging results that were available during my care of the patient were reviewed by me and considered in my medical decision making (see chart for details).   Given the patient's past medical history and PMI considered multiple DDx including recurrent gastric ulceration, obstructive cholecystitis, pancreatitis, diverticulitis.  Presence of black stool suggests upper GI however presence of red bloody stool suggest large colon etiology.  Discussed differential with patient and recommended she proceed to ED for immediate evaluation including potential imaging given her distress in clinic today due to severe pain.  Asked her to follow-up with her primary care provider who she said performed annual physical several weeks ago before the symptoms started.   Final Clinical Impressions(s) / UC Diagnoses   Final diagnoses:  None     Discharge Instructions      Please proceed to ED for immediate evaluation of your symptoms.   ED Prescriptions   None    PDMP not  reviewed this encounter.   Rose Phi, [place] 10/18/21 0904    Rose Phi, [place] 10/27/21 [phone]

## 2021-10-18 NOTE — Discharge Instructions (Addendum)
Please proceed to ED for immediate evaluation of your symptoms.

## 2021-10-18 NOTE — ED Triage Notes (Signed)
Pt. Presents w/ Abdomina painl under the left breast, black and bloody stools, along w/ nausea for the past 10 days. Pt. States she has a hx of kissing ulcers. Pt. Expresses concern for a potential gallbladder complication.

## 2022-07-15 ENCOUNTER — Ambulatory Visit
Admission: RE | Admit: 2022-07-15 | Discharge: 2022-07-15 | Disposition: A | Discharge status: Home / Self Care | Payer: BC Managed Care – PPO | Source: Ambulatory Visit | Attending: Emergency Medicine | Admitting: Emergency Medicine

## 2022-07-15 VITALS — BP 96/62 | HR 89 | Temp 98.2°F | Resp 18

## 2022-07-15 DIAGNOSIS — J0100 Acute maxillary sinusitis, unspecified: Secondary | ICD-10-CM | POA: Diagnosis not present

## 2022-07-15 MED ORDER — AMOXICILLIN 875 MG PO TABS: 875.0000 mg | ORAL_TABLET | Freq: Two times a day (BID) | ORAL | 0 refills | Status: AC

## 2022-07-15 NOTE — Discharge Instructions (Signed)
Take the amoxicillin as directed.  Follow up with your primary care provider if your symptoms are not improving.   ° ° °

## 2022-07-15 NOTE — ED Provider Notes (Signed)
Suzanne Stout    CSN: [id number] Arrival date & time: 07/15/22  1317      History   Chief Complaint Chief Complaint  Patient presents with   Nasal Congestion    Severe congestion, cough, sneezing. - Entered by patient    HPI Suzanne Stout is a 47 y.o. female.  Patient presents with 5-day history of nasal congestion, sinus pressure, postnasal drip, runny nose, mild cough.  Multiple OTC treatments attempted without improvement.  The sinus pressure is getting worse.  No fever, shortness of breath, chest pain, or other symptoms.  Her medical history includes hypertension.  The history is provided by the patient and medical records.    Past Medical History:  Diagnosis Date   Anxiety    Complication of anesthesia 2003   had a hard time waking    Hypertension    Psoriasis    RLS (restless legs syndrome)     Patient Active Problem List   Diagnosis Date Noted   S/P laparoscopic hysterectomy 09/01/2020   Abnormal uterine bleeding (AUB)    Intramural leiomyoma of uterus    Unwanted fertility    Abdominal pain 01/17/2016   Gastric distention 01/17/2016   HTN (hypertension) 01/17/2016   RLS (restless legs syndrome) 01/17/2016    Past Surgical History:  Procedure Laterality Date   BREAST LUMPECTOMY Right 2002   ESOPHAGOGASTRODUODENOSCOPY (EGD) WITH PROPOFOL N/A 01/19/2016   Procedure: ESOPHAGOGASTRODUODENOSCOPY (EGD) WITH PROPOFOL;  Surgeon: Wyline Mood, MD;  Location: [facility];  Service: Endoscopy;  Laterality: N/A;   LAPAROSCOPIC BILATERAL SALPINGECTOMY Bilateral 04/12/2020   Procedure: LAPAROSCOPIC BILATERAL SALPINGECTOMY; IUD INSERTION;  Surgeon: Vena Austria, MD;  Location: [facility];  Service: Gynecology;  Laterality: Bilateral;   LAPAROSCOPIC HYSTERECTOMY N/A 09/01/2020   Procedure: HYSTERECTOMY TOTAL LAPAROSCOPIC REMOVAL OF IUD AND CYSTOSCOPY;  Surgeon: Vena Austria, MD;  Location: [facility];  Service: Gynecology;  Laterality: N/A;   VAGINAL  HYSTERECTOMY      OB History     Gravida  1   Para  1   Term  1   Preterm      AB      Living  1      SAB      IAB      Ectopic      Multiple      Live Births               Home Medications    Prior to Admission medications   Medication Sig Start Date End Date Taking? Authorizing Provider  amoxicillin (AMOXIL) 875 MG tablet Take 1 tablet (875 mg total) by mouth 2 (two) times daily for 10 days. 07/15/22 07/25/22 Yes Mickie Bail, NP  gabapentin (NEURONTIN) 100 MG capsule 1 tablet po once daily on day 1, 1 tablet po bid day 2, then 1 tablet po tid 10/11/20   Vena Austria, MD  lisinopril-hydrochlorothiazide (ZESTORETIC) 10-12.5 MG tablet Take 1 tablet by mouth every evening.    [provider]  Melatonin 10 MG TABS Take 10 mg by mouth at bedtime as needed (sleep.). Gummies    [provider]  rOPINIRole (REQUIP) 0.25 MG tablet Take 0.25 mg by mouth at bedtime.    [provider]  Vitamin D, Ergocalciferol, (DRISDOL) 1.25 MG (50000 UNIT) CAPS capsule Take 50,000 Units by mouth once a week. 10/10/21   [provider]    Family History Family History  Problem Relation Age of Onset   Gallbladder disease Other  Hypertension Mother    Alcoholism Father     Social History Social History   Tobacco Use   Smoking status: Never   Smokeless tobacco: Never  Vaping Use   Vaping Use: Never used  Substance Use Topics   Alcohol use: No   Drug use: Never     Allergies   Latex   Review of Systems Review of Systems  Constitutional:  Negative for chills and fever.  HENT:  Positive for congestion, postnasal drip, rhinorrhea and sinus pressure. Negative for ear pain and sore throat.   Respiratory:  Positive for cough. Negative for shortness of breath.   Cardiovascular:  Negative for chest pain and palpitations.  Skin:  Negative for color change and rash.  All other systems reviewed and are negative.    Physical  Exam Triage Vital Signs ED Triage Vitals  Enc Vitals Group     BP      Pulse      Resp      Temp      Temp src      SpO2      Weight      Height      Head Circumference      Peak Flow      Pain Score      Pain Loc      Pain Edu?      Excl. in GC?    No data found.  Updated Vital Signs BP 96/62   Pulse 89   Temp 98.2 F (36.8 C)   Resp 18   SpO2 98%   Visual Acuity Right Eye Distance:   Left Eye Distance:   Bilateral Distance:    Right Eye Near:   Left Eye Near:    Bilateral Near:     Physical Exam Vitals and nursing note reviewed.  Constitutional:      General: She is not in acute distress.    Appearance: She is well-developed.  HENT:     Right Ear: Tympanic membrane normal.     Left Ear: Tympanic membrane normal.     Nose: Congestion and rhinorrhea present.     Mouth/Throat:     Mouth: Mucous membranes are moist.     Pharynx: Oropharynx is clear.  Cardiovascular:     Rate and Rhythm: Normal rate and regular rhythm.     Heart sounds: Normal heart sounds.  Pulmonary:     Effort: Pulmonary effort is normal. No respiratory distress.     Breath sounds: Normal breath sounds.  Musculoskeletal:     Cervical back: Neck supple.  Skin:    General: Skin is warm and dry.  Neurological:     Mental Status: She is alert.  Psychiatric:        Mood and Affect: Mood normal.        Behavior: Behavior normal.      UC Treatments / Results  Labs (all labs ordered are listed, but only abnormal results are displayed) Labs Reviewed - No data to display  EKG   Radiology No results found.  Procedures Procedures (including critical care time)  Medications Ordered in UC Medications - No data to display  Initial Impression / Assessment and Plan / UC Course  I have reviewed the triage vital signs and the nursing notes.  Pertinent labs & imaging results that were available during my care of the patient were reviewed by me and considered in my medical decision  making (see chart for details).   Acute  sinusitis.  Patient is not improving with OTC treatment.  Treating today with amoxicillin.  Discussed symptomatic treatment including Mucinex.  Education provided on sinus infection.  Instructed patient to follow up with her PCP if her symptoms are not improving.  She agrees to plan of care.    Final Clinical Impressions(s) / UC Diagnoses   Final diagnoses:  Acute non-recurrent maxillary sinusitis     Discharge Instructions      Take the amoxicillin as directed.  Follow up with your primary care provider if your symptoms are not improving.        ED Prescriptions     Medication Sig Dispense Auth. Provider   amoxicillin (AMOXIL) 875 MG tablet Take 1 tablet (875 mg total) by mouth 2 (two) times daily for 10 days. 20 tablet Mickie Bail, NP      PDMP not reviewed this encounter.   Mickie Bail, NP 07/15/22 1355

## 2022-07-15 NOTE — ED Triage Notes (Signed)
Provider triage  

## 2022-09-12 ENCOUNTER — Other Ambulatory Visit: Payer: Self-pay

## 2022-09-12 ENCOUNTER — Ambulatory Visit
Admission: RE | Admit: 2022-09-12 | Discharge: 2022-09-12 | Payer: Managed Care, Other (non HMO) | Source: Ambulatory Visit | Attending: Family Medicine | Admitting: Family Medicine

## 2022-09-12 ENCOUNTER — Encounter: Payer: Self-pay | Admitting: *Deleted

## 2022-09-12 ENCOUNTER — Emergency Department
Admission: EM | Admit: 2022-09-12 | Discharge: 2022-09-12 | Disposition: A | Discharge status: Home / Self Care | Payer: Managed Care, Other (non HMO) | Attending: Emergency Medicine | Admitting: Emergency Medicine

## 2022-09-12 VITALS — BP 118/87 | HR 113 | Temp 98.2°F | Resp 16 | Ht 64.5 in | Wt 175.0 lb

## 2022-09-12 DIAGNOSIS — R197 Diarrhea, unspecified: Secondary | ICD-10-CM

## 2022-09-12 DIAGNOSIS — K2971 Gastritis, unspecified, with bleeding: Secondary | ICD-10-CM | POA: Insufficient documentation

## 2022-09-12 DIAGNOSIS — R1010 Upper abdominal pain, unspecified: Secondary | ICD-10-CM

## 2022-09-12 DIAGNOSIS — R112 Nausea with vomiting, unspecified: Secondary | ICD-10-CM

## 2022-09-12 DIAGNOSIS — Z1152 Encounter for screening for COVID-19: Secondary | ICD-10-CM | POA: Insufficient documentation

## 2022-09-12 DIAGNOSIS — I10 Essential (primary) hypertension: Secondary | ICD-10-CM | POA: Diagnosis not present

## 2022-09-12 LAB — LIPASE, BLOOD: Lipase: 46 U/L (ref 11–51)

## 2022-09-12 LAB — COMPREHENSIVE METABOLIC PANEL
ALT: 17 U/L (ref 0–44)
AST: 16 U/L (ref 15–41)
Albumin: 4 g/dL (ref 3.5–5.0)
Alkaline Phosphatase: 62 U/L (ref 38–126)
Anion gap: 10 (ref 5–15)
BUN: 14 mg/dL (ref 6–20)
CO2: 23 mmol/L (ref 22–32)
Calcium: 9.3 mg/dL (ref 8.9–10.3)
Chloride: 104 mmol/L (ref 98–111)
Creatinine, Ser: 1.04 mg/dL — ABNORMAL HIGH (ref 0.44–1.00)
GFR, Estimated: >60 mL/min (ref >60)
Glucose, Bld: 104 mg/dL — ABNORMAL HIGH (ref 70–99)
Potassium: 3.8 mmol/L (ref 3.5–5.1)
Sodium: 137 mmol/L (ref 135–145)
Total Bilirubin: 0.6 mg/dL (ref 0.3–1.2)
Total Protein: 7.6 g/dL (ref 6.5–8.1)

## 2022-09-12 LAB — URINALYSIS, ROUTINE W REFLEX MICROSCOPIC
Bilirubin Urine: NEGATIVE
Glucose, UA: NEGATIVE mg/dL
Ketones, ur: 5 mg/dL — AB
Leukocytes,Ua: NEGATIVE
Nitrite: NEGATIVE
Protein, ur: NEGATIVE mg/dL
Specific Gravity, Urine: 1.021 (ref 1.005–1.030)
pH: 5 (ref 5.0–8.0)

## 2022-09-12 LAB — SARS CORONAVIRUS 2 BY RT PCR: SARS Coronavirus 2 by RT PCR: NEGATIVE

## 2022-09-12 LAB — CBC
HCT: 38 % (ref 36.0–46.0)
Hemoglobin: 12.8 g/dL (ref 12.0–15.0)
MCH: 31.5 pg (ref 26.0–34.0)
MCHC: 33.7 g/dL (ref 30.0–36.0)
MCV: 93.6 fL (ref 80.0–100.0)
Platelets: 299 10*3/uL (ref 150–400)
RBC: 4.06 MIL/uL (ref 3.87–5.11)
RDW: 12.5 % (ref 11.5–15.5)
WBC: 9.9 10*3/uL (ref 4.0–10.5)
nRBC: 0 % (ref 0.0–0.2)

## 2022-09-12 LAB — TROPONIN I (HIGH SENSITIVITY): Troponin I (High Sensitivity): 3 ng/L (ref <18)

## 2022-09-12 MED ORDER — OMEPRAZOLE MAGNESIUM 20 MG PO TBEC: 20.0000 mg | DELAYED_RELEASE_TABLET | Freq: Two times a day (BID) | ORAL | 0 refills | Status: DC

## 2022-09-12 MED ORDER — ONDANSETRON 4 MG PO TBDP: 4.0000 mg | ORAL_TABLET | Freq: Three times a day (TID) | ORAL | 0 refills | Status: DC | PRN

## 2022-09-12 MED ORDER — LIDOCAINE VISCOUS HCL 2 % MT SOLN
15.0000 mL | Freq: Once | OROMUCOSAL | Status: AC
  Administered 2022-09-12: 15 mL via OROMUCOSAL
  Filled 2022-09-12: qty 15

## 2022-09-12 MED ORDER — ALUM & MAG HYDROXIDE-SIMETH 200-200-20 MG/5ML PO SUSP
30.0000 mL | Freq: Once | ORAL | Status: AC
  Administered 2022-09-12: 30 mL via ORAL
  Filled 2022-09-12: qty 30

## 2022-09-12 MED ORDER — PANTOPRAZOLE SODIUM 40 MG PO TBEC
40.0000 mg | DELAYED_RELEASE_TABLET | Freq: Once | ORAL | Status: AC
  Administered 2022-09-12: 40 mg via ORAL
  Filled 2022-09-12: qty 1

## 2022-09-12 MED ORDER — ONDANSETRON HCL 4 MG/2ML IJ SOLN
4.0000 mg | Freq: Once | INTRAMUSCULAR | Status: AC
  Administered 2022-09-12: 4 mg via INTRAMUSCULAR

## 2022-09-12 MED ORDER — METOCLOPRAMIDE HCL 10 MG PO TABS
10.0000 mg | ORAL_TABLET | Freq: Once | ORAL | Status: AC
  Administered 2022-09-12: 10 mg via ORAL
  Filled 2022-09-12: qty 1

## 2022-09-12 MED ORDER — SUCRALFATE 1 G PO TABS: 1.0000 g | ORAL_TABLET | Freq: Four times a day (QID) | ORAL | 0 refills | Status: AC

## 2022-09-12 MED ORDER — LIDOCAINE VISCOUS HCL 2 % MT SOLN
15.0000 mL | Freq: Once | OROMUCOSAL | Status: DC
Start: 2022-09-12 — End: 2022-09-12

## 2022-09-12 MED ORDER — ALUM & MAG HYDROXIDE-SIMETH 200-200-20 MG/5ML PO SUSP: 30.0000 mL | Freq: Once | ORAL | Status: DC

## 2022-09-12 MED ORDER — ONDANSETRON HCL 4 MG/2ML IJ SOLN: 4.0000 mg | Freq: Once | INTRAMUSCULAR | Status: DC

## 2022-09-12 MED ORDER — SODIUM CHLORIDE 0.9 % IV BOLUS
1000.0000 mL | Freq: Once | INTRAVENOUS | Status: DC
Start: 2022-09-12 — End: 2022-09-12

## 2022-09-12 NOTE — ED Provider Notes (Signed)
[facility] Provider Note    Event Date/Time   First MD Initiated Contact with Patient 09/12/22 1652     (approximate)   History   Abdominal Pain and Emesis   HPI  Suzanne Stout is a 47 y.o. female past medical significant for peptic ulcer disease, hypertension, who presents to the emergency department with upper abdominal pain.  Upper abdominal pain that has been significant over the past 2 to 3 days.  Yesterday had multiple episodes of vomiting.  Denies any blood in the vomit.  Yesterday also had multiple episodes of diarrhea that she stated had dark red blood in her stool.  Denies fever or chills.  Prior hysterectomy.  Denies any significant alcohol use or NSAID use.  History of significant peptic ulcer disease and last EGD was in 2018.  Not currently on a PPI.     Physical Exam   Triage Vital Signs: ED Triage Vitals  Encounter Vitals Group     BP 09/12/22 1627 121/78     Systolic BP Percentile --      Diastolic BP Percentile --      Pulse Rate 09/12/22 1627 94     Resp 09/12/22 1627 18     Temp 09/12/22 1627 98.2 F (36.8 C)     Temp Source 09/12/22 1627 Oral     SpO2 09/12/22 1627 100 %     Weight 09/12/22 1628 175 lb (79.4 kg)     Height 09/12/22 1628 5\' 4"  (1.626 m)     Head Circumference --      Peak Flow --      Pain Score 09/12/22 1628 7     Pain Loc --      Pain Education --      Exclude from Growth Chart --     Most recent vital signs: Vitals:   09/12/22 1627  BP: 121/78  Pulse: 94  Resp: 18  Temp: 98.2 F (36.8 C)  SpO2: 100%    Physical Exam Constitutional:      Appearance: She is well-developed.  HENT:     Head: Atraumatic.  Eyes:     Conjunctiva/sclera: Conjunctivae normal.  Cardiovascular:     Rate and Rhythm: Regular rhythm.  Pulmonary:     Effort: No respiratory distress.  Abdominal:     General: There is no distension.     Tenderness: There is abdominal tenderness in the epigastric area. Negative  signs include Murphy's sign.  Musculoskeletal:        General: Normal range of motion.     Cervical back: Normal range of motion.  Skin:    General: Skin is warm.  Neurological:     Mental Status: She is alert. Mental status is at baseline.     IMPRESSION / MDM / ASSESSMENT AND PLAN / ED COURSE  I reviewed the triage vital signs and the nursing notes.  Differential diagnosis including upper GI bleed, peptic ulcer disease, gastritis, pancreatitis, lower GI bleed, ACS  EKG  I, Corena Herter, the attending physician, personally viewed and interpreted this ECG.   Rate: Normal  Rhythm: Normal sinus  Axis: Normal  Intervals: Normal  ST&T Change: None 2 PVCs  No tachycardic or bradycardic dysrhythmias while on cardiac telemetry.   LABS (all labs ordered are listed, but only abnormal results are displayed) Labs interpreted as -    Labs Reviewed  COMPREHENSIVE METABOLIC PANEL - Abnormal; Notable for the following components:      Result  Value   Glucose, Bld 104 (*)    Creatinine, Ser 1.04 (*)    All other components within normal limits  URINALYSIS, ROUTINE W REFLEX MICROSCOPIC - Abnormal; Notable for the following components:   Color, Urine YELLOW (*)    APPearance HAZY (*)    Hgb urine dipstick SMALL (*)    Ketones, ur 5 (*)    Bacteria, UA RARE (*)    All other components within normal limits  SARS CORONAVIRUS 2 BY RT PCR  LIPASE, BLOOD  CBC  TROPONIN I (HIGH SENSITIVITY)     MDM  Hemoglobin stable at 12.8.  No significant leukocytosis.  UA with no signs of a urinary tract infection.  Creatinine at baseline.  No elevation of BUN.  No significant electrolyte abnormality.  Low risk for significant upper GI bleed based on Glascow Blatchford bleeding score.   Treated with GI cocktail, PPI  Troponin negative, doubt ACS.  Clinical picture is most consistent with gastritis/peptic ulcer disease.  Hemoglobin stable.  Pain improved on reevaluation have a low suspicion for  acute cholecystitis or symptomatic cholelithiasis.  Patient will be restarted on a PPI.  Given a prescription for Carafate and discussed close follow-up with gastroenterology.  Given return precautions for any worsening symptoms.     PROCEDURES:  Critical Care performed: No  Procedures  Patient's presentation is most consistent with acute complicated illness / injury requiring diagnostic workup.   MEDICATIONS ORDERED IN ED: Medications  alum & mag hydroxide-simeth (MAALOX/MYLANTA) 200-200-20 MG/5ML suspension 30 mL (30 mLs Oral Given 09/12/22 1733)  lidocaine (XYLOCAINE) 2 % viscous mouth solution 15 mL (15 mLs Mouth/Throat Given 09/12/22 1733)  pantoprazole (PROTONIX) EC tablet 40 mg (40 mg Oral Given 09/12/22 1731)  metoCLOPramide (REGLAN) tablet 10 mg (10 mg Oral Given 09/12/22 1731)    FINAL CLINICAL IMPRESSION(S) / ED DIAGNOSES   Final diagnoses:  Gastrointestinal hemorrhage associated with gastritis, unspecified gastritis type  Upper abdominal pain     Rx / DC Orders   ED Discharge Orders          Ordered    omeprazole (PRILOSEC OTC) 20 MG tablet  2 times daily        09/12/22 1851    ondansetron (ZOFRAN-ODT) 4 MG disintegrating tablet  Every 8 hours PRN        09/12/22 1851    sucralfate (CARAFATE) 1 g tablet  4 times daily        09/12/22 1851             Note:  This document was prepared using Dragon voice recognition software and may include unintentional dictation errors.   Corena Herter, MD 09/12/22 [phone]

## 2022-09-12 NOTE — Discharge Instructions (Addendum)
You were seen in the emergency department for upper abdominal pain and blood in your stool.  Your hemoglobin level was normal.  Your lab work was overall normal.  It is importantly call and follow-up closely with a GI doctor, you may need another scope.  Return to the emergency department if you have worsening symptoms or worsening findings of bleeding.  You are discharged home on an acid reducing medication.  You are also given a prescription for Carafate which you can take 4 times a day.  zofran (ondansetron) - nausea medication, take 1 tablet every 8 hours as needed for nausea/vomiting.  Avoid any NSAIDs (ibuprofen, Aleve, Advil, Motrin) you can take 1000 mg of acetaminophen (Tylenol) every 6 hours as needed for abdominal pain.

## 2022-09-12 NOTE — ED Provider Notes (Signed)
[facility]    CSN: [id number] Arrival date & time: 09/12/22  1429      History   Chief Complaint Chief Complaint  Patient presents with  . Abdominal Pain    Appt  . Nausea    HPI Suzanne Stout is a 47 y.o. female.   HPI  Suzanne Stout presents for abdominal pain under her breast and intermittent chest discomfort which started on Monday. She had similar sx before and they needed pump her stomach and had "kissing ulcers". Has vomiting, nausea and diarrhea. Has had at least 5 episodes of vomiting and 2-3 episodes of bloody diarrhea today. None of her brothers have a gall bladder any more.   Reports mom has autoimmune disease.    Has been taking NSAIDs for pain.  She has been under a lot of stress recently. Takes omeprazole daily.    Past Medical History:  Diagnosis Date  . Anxiety   . Complication of anesthesia 2003   had a hard time waking   . Hypertension   . Psoriasis   . RLS (restless legs syndrome)     Patient Active Problem List   Diagnosis Date Noted  . S/P laparoscopic hysterectomy 09/01/2020  . Abnormal uterine bleeding (AUB)   . Intramural leiomyoma of uterus   . Unwanted fertility   . Abdominal pain 01/17/2016  . Gastric distention 01/17/2016  . HTN (hypertension) 01/17/2016  . RLS (restless legs syndrome) 01/17/2016    Past Surgical History:  Procedure Laterality Date  . BREAST LUMPECTOMY Right 2002  . ESOPHAGOGASTRODUODENOSCOPY (EGD) WITH PROPOFOL N/A 01/19/2016   Procedure: ESOPHAGOGASTRODUODENOSCOPY (EGD) WITH PROPOFOL;  Surgeon: Wyline Mood, MD;  Location: [facility];  Service: Endoscopy;  Laterality: N/A;  . LAPAROSCOPIC BILATERAL SALPINGECTOMY Bilateral 04/12/2020   Procedure: LAPAROSCOPIC BILATERAL SALPINGECTOMY; IUD INSERTION;  Surgeon: Vena Austria, MD;  Location: [facility];  Service: Gynecology;  Laterality: Bilateral;  . LAPAROSCOPIC HYSTERECTOMY N/A 09/01/2020   Procedure: HYSTERECTOMY TOTAL LAPAROSCOPIC REMOVAL OF IUD  AND CYSTOSCOPY;  Surgeon: Vena Austria, MD;  Location: [facility];  Service: Gynecology;  Laterality: N/A;  . VAGINAL HYSTERECTOMY      OB History     Gravida  1   Para  1   Term  1   Preterm      AB      Living  1      SAB      IAB      Ectopic      Multiple      Live Births               Home Medications    Prior to Admission medications   Medication Sig Start Date End Date Taking? Authorizing Provider  gabapentin (NEURONTIN) 100 MG capsule 1 tablet po once daily on day 1, 1 tablet po bid day 2, then 1 tablet po tid 10/11/20  Yes Vena Austria, MD  lisinopril-hydrochlorothiazide (ZESTORETIC) 10-12.5 MG tablet Take 1 tablet by mouth every evening.   Yes [provider]  Melatonin 10 MG TABS Take 10 mg by mouth at bedtime as needed (sleep.). Gummies   Yes [provider]  rOPINIRole (REQUIP) 0.25 MG tablet Take 0.25 mg by mouth at bedtime.   Yes [provider]  scopolamine (TRANSDERM-SCOP) 1 MG/3DAYS 1 patch every 3 (three) days. 06/30/22  Yes [provider]  Vitamin D, Ergocalciferol, (DRISDOL) 1.25 MG (50000 UNIT) CAPS capsule Take 50,000 Units by mouth once a week. 10/10/21  Yes [provider]    Family History Family History  Problem Relation Age of Onset  . Gallbladder disease Other   . Hypertension Mother   . Alcoholism Father     Social History Social History   Tobacco Use  . Smoking status: Never  . Smokeless tobacco: Never  Vaping Use  . Vaping status: Never Used  Substance Use Topics  . Alcohol use: No  . Drug use: Never     Allergies   Latex   Review of Systems Review of Systems :negative unless otherwise stated in HPI.      Physical Exam Triage Vital Signs ED Triage Vitals  Encounter Vitals Group     BP 09/12/22 1434 118/87     Systolic BP Percentile --      Diastolic BP Percentile --      Pulse Rate 09/12/22 1434 (!) 113     Resp 09/12/22 1434 16     Temp 09/12/22  1434 98.2 F (36.8 C)     Temp Source 09/12/22 1434 Oral     SpO2 09/12/22 1434 99 %     Weight 09/12/22 1434 175 lb (79.4 kg)     Height 09/12/22 1434 5' 4.5" (1.638 m)     Head Circumference --      Peak Flow --      Pain Score 09/12/22 1437 7     Pain Loc --      Pain Education --      Exclude from Growth Chart --    No data found.  Updated Vital Signs BP 118/87 (BP Location: Left Arm)   Pulse (!) 113   Temp 98.2 F (36.8 C) (Oral)   Resp 16   Ht 5' 4.5" (1.638 m)   Wt 79.4 kg   SpO2 99%   BMI 29.57 kg/m   Visual Acuity Right Eye Distance:   Left Eye Distance:   Bilateral Distance:    Right Eye Near:   Left Eye Near:    Bilateral Near:     Physical Exam  GEN: ill but non-toxic appearing female CV: regular  rhythm, tachycardic  RESP: no increased work of breathing, clear to ascultation bilaterally ABD: Bowel sounds present. Soft, epigastric and RUQ tenderness, no appreciable distension.  No guarding, no rebound, no appreciable hepatosplenomegaly MSK: no extremity edema SKIN: warm, dry NEURO: alert, oriented, moves all extremities appropriately   UC Treatments / Results  Labs (all labs ordered are listed, but only abnormal results are displayed) Labs Reviewed - No data to display   EKG  If EKG performed, see my interpretation and MDM section  Radiology No results found.   Procedures Procedures (including critical care time)  Medications Ordered in UC Medications  ondansetron (ZOFRAN) injection 4 mg (4 mg Intramuscular Given 09/12/22 1537)    Initial Impression / Assessment and Plan / UC Course  I have reviewed the triage vital signs and the nursing notes.  Pertinent labs & imaging results that were available during my care of the patient were reviewed by me and considered in my medical decision making (see chart for details).      Patient is a  47 y.o. femalewith history *** who presents after having insidious severe abdominal pain about  *** ago.  Overall, patient is well-appearing, well-hydrated, and in no acute distress.  Vital signs stable.  Suzanne Stout afebrile.  Exam is ***not concerning for an acute abdomen.  Obtained UA, urine pregnancy***, CBC, CMP, and lipase.  No personal history of kidney  stones.      DDX:  -UTI: Urine unremarkable -STI: Not sexually active -Ovarian torsion versus cyst: Pain is epigastric less likely -Biliary colic/gallstone: Labs today, consider right upper quadrant ultrasound -Pancreatitis: Lipase today -Gastroenteritis: Not likely given no vomiting or diarrhea -Kidney stone: No hematuria on UA, no CVA tenderness -Constipation: None reported -Appendicitis: status post recent appendectomy   Constipation: ***Provided and reviewed constipation clean out and maintenance plan with patient and ***. Discussed eating small meals frequently and increased fluid intake, as well as gradual increase in fiber intake with dried fruits, vegetables with skins, beans, whole grains and cereal. Goal ***25-35g of fiber per day. Encouraged drinking hot beverages and prune juice; add probiotic-containing foods like pasteurized yogurt and kefir; encourage increased physical activity.     Follow-up, return and ED precautions given.  Discussed MDM, treatment plan and plan for follow-up with patient/parent who agrees with plan.    Final Clinical Impressions(s) / UC Diagnoses   Final diagnoses:  Pain of upper abdomen  Nausea and vomiting, unspecified vomiting type  Bloody diarrhea     Discharge Instructions      You have been advised to follow up immediately in the emergency department for concerning signs or symptoms as discussed during your visit. If you declined EMS transport, please have a family member take you directly to the ED at this time. Do not delay.   Based on concerns about condition, if you do not follow up in the ED, you may risk poor outcomes including worsening of condition, delayed treatment  and potentially life threatening issues. If you have declined to go to the ED at this time, you should call your PCP immediately to set up a follow up appointment.        ED Prescriptions   None    PDMP not reviewed this encounter.

## 2022-09-12 NOTE — Discharge Instructions (Addendum)

## 2022-09-12 NOTE — ED Triage Notes (Signed)
Pt c/o RUQ abd pain & N/V/D x2 days. Hx of ulcers. Had more than 5 episodes of emesis today. Unable to keep any foods or fluids down.

## 2022-09-12 NOTE — ED Notes (Signed)
Patient is being discharged from the Urgent Care and sent to the Emergency Department via POV . Per Dr.Brimage, patient is in need of higher level of care due to worsening abd pain & N/V. Patient is aware and verbalizes understanding of plan of care.  Vitals:   09/12/22 1434  BP: 118/87  Pulse: (!) 113  Resp: 16  Temp: 98.2 F (36.8 C)  SpO2: 99%

## 2022-09-12 NOTE — ED Triage Notes (Addendum)
Pt reports abd pain with vomiting and diarrhea for 3 days.  Pt reports back pain.  Denies urinary sx  pt alert.   Pt sent from [facility] urgent care.  Pt states she received zofran with some relief.

## 2022-09-13 ENCOUNTER — Emergency Department
Admission: EM | Admit: 2022-09-13 | Discharge: 2022-09-13 | Disposition: A | Discharge status: Home / Self Care | Payer: Managed Care, Other (non HMO) | Attending: Emergency Medicine | Admitting: Emergency Medicine

## 2022-09-13 ENCOUNTER — Emergency Department: Payer: Managed Care, Other (non HMO)

## 2022-09-13 ENCOUNTER — Encounter: Payer: Self-pay | Admitting: Emergency Medicine

## 2022-09-13 DIAGNOSIS — K279 Peptic ulcer, site unspecified, unspecified as acute or chronic, without hemorrhage or perforation: Secondary | ICD-10-CM | POA: Insufficient documentation

## 2022-09-13 DIAGNOSIS — D72829 Elevated white blood cell count, unspecified: Secondary | ICD-10-CM | POA: Insufficient documentation

## 2022-09-13 DIAGNOSIS — I10 Essential (primary) hypertension: Secondary | ICD-10-CM | POA: Diagnosis not present

## 2022-09-13 DIAGNOSIS — K2970 Gastritis, unspecified, without bleeding: Secondary | ICD-10-CM | POA: Insufficient documentation

## 2022-09-13 DIAGNOSIS — R1010 Upper abdominal pain, unspecified: Secondary | ICD-10-CM | POA: Diagnosis present

## 2022-09-13 DIAGNOSIS — Z79899 Other long term (current) drug therapy: Secondary | ICD-10-CM | POA: Insufficient documentation

## 2022-09-13 LAB — COMPREHENSIVE METABOLIC PANEL WITH GFR
ALT: 17 U/L (ref 0–44)
AST: 15 U/L (ref 15–41)
Albumin: 3.9 g/dL (ref 3.5–5.0)
Alkaline Phosphatase: 62 U/L (ref 38–126)
Anion gap: 12 (ref 5–15)
BUN: 12 mg/dL (ref 6–20)
CO2: 22 mmol/L (ref 22–32)
Calcium: 9.3 mg/dL (ref 8.9–10.3)
Chloride: 101 mmol/L (ref 98–111)
Creatinine, Ser: 1.1 mg/dL — ABNORMAL HIGH (ref 0.44–1.00)
GFR, Estimated: >60 mL/min
Glucose, Bld: 126 mg/dL — ABNORMAL HIGH (ref 70–99)
Potassium: 3.2 mmol/L — ABNORMAL LOW (ref 3.5–5.1)
Sodium: 135 mmol/L (ref 135–145)
Total Bilirubin: 1 mg/dL (ref 0.3–1.2)
Total Protein: 7.4 g/dL (ref 6.5–8.1)

## 2022-09-13 LAB — CBC
HCT: 36 % (ref 36.0–46.0)
Hemoglobin: 12.7 g/dL (ref 12.0–15.0)
MCH: 32 pg (ref 26.0–34.0)
MCHC: 35.3 g/dL (ref 30.0–36.0)
MCV: 90.7 fL (ref 80.0–100.0)
Platelets: 299 10*3/uL (ref 150–400)
RBC: 3.97 MIL/uL (ref 3.87–5.11)
RDW: 12.3 % (ref 11.5–15.5)
WBC: 17.5 10*3/uL — ABNORMAL HIGH (ref 4.0–10.5)
nRBC: 0 % (ref 0.0–0.2)

## 2022-09-13 LAB — LIPASE, BLOOD: Lipase: 34 U/L (ref 11–51)

## 2022-09-13 MED ORDER — ALUM & MAG HYDROXIDE-SIMETH 200-200-20 MG/5ML PO SUSP
30.0000 mL | Freq: Once | ORAL | Status: AC
  Administered 2022-09-13: 30 mL via ORAL
  Filled 2022-09-13: qty 30

## 2022-09-13 MED ORDER — OXYCODONE-ACETAMINOPHEN 5-325 MG PO TABS: 2.0000 | ORAL_TABLET | Freq: Four times a day (QID) | ORAL | 0 refills | Status: DC | PRN

## 2022-09-13 MED ORDER — ONDANSETRON HCL 4 MG/2ML IJ SOLN
4.0000 mg | Freq: Once | INTRAMUSCULAR | Status: AC
  Administered 2022-09-13: 4 mg via INTRAVENOUS
  Filled 2022-09-13: qty 2

## 2022-09-13 MED ORDER — SODIUM CHLORIDE 0.9 % IV BOLUS (SEPSIS)
1000.0000 mL | Freq: Once | INTRAVENOUS | Status: AC
  Administered 2022-09-13: 1000 mL via INTRAVENOUS

## 2022-09-13 MED ORDER — PROMETHAZINE HCL 25 MG PO TABS: 25.0000 mg | ORAL_TABLET | Freq: Four times a day (QID) | ORAL | 0 refills | Status: AC | PRN

## 2022-09-13 MED ORDER — SODIUM CHLORIDE 0.9 % IV SOLN
25.0000 mg | Freq: Four times a day (QID) | INTRAVENOUS | Status: DC | PRN
  Administered 2022-09-13: 25 mg via INTRAVENOUS
  Filled 2022-09-13: qty 1

## 2022-09-13 MED ORDER — IOHEXOL 300 MG/ML  SOLN
100.0000 mL | Freq: Once | INTRAMUSCULAR | Status: AC | PRN
  Administered 2022-09-13: 100 mL via INTRAVENOUS

## 2022-09-13 MED ORDER — HYDROMORPHONE HCL 1 MG/ML IJ SOLN
1.0000 mg | Freq: Once | INTRAMUSCULAR | Status: AC
  Administered 2022-09-13: 1 mg via INTRAVENOUS
  Filled 2022-09-13: qty 1

## 2022-09-13 MED ORDER — PANTOPRAZOLE SODIUM 40 MG IV SOLR
40.0000 mg | Freq: Once | INTRAVENOUS | Status: AC
  Administered 2022-09-13: 40 mg via INTRAVENOUS
  Filled 2022-09-13: qty 10

## 2022-09-13 MED ORDER — MORPHINE SULFATE (PF) 4 MG/ML IV SOLN
4.0000 mg | Freq: Once | INTRAVENOUS | Status: AC
  Administered 2022-09-13: 4 mg via INTRAVENOUS
  Filled 2022-09-13: qty 1

## 2022-09-13 NOTE — ED Triage Notes (Signed)
Pt presents ambulatory to triage via POV with complaints of lower abdominal pain with associated N/V. Pt was seen here yesterday for the same and was told to take Tylenol which she took around 2100 without any improvement in sx. Pt rates the pain 10/10. A&Ox4 at this time. Denies CP or SOB.

## 2022-09-13 NOTE — ED Notes (Signed)
 Provided pt with discharge instructions and education. All of pt questions answered. Pt in possession of all belongings. Pt AAOX4 and stable at time of discharge.Pt wheeled into husband vehicle safely.

## 2022-09-13 NOTE — Discharge Instructions (Addendum)
Please avoid NSAIDs such as aspirin (Goody powders), ibuprofen (Motrin, Advil), naproxen (Aleve) as these may worsen your symptoms.  Tylenol 1000 mg every 6 hours is safe to take as long as you have no history of liver problems (heavy alcohol use, cirrhosis, hepatitis).  Please avoid spicy, acidic (citrus fruits, tomato based sauces, salsa), greasy, fatty foods.  Please avoid caffeine and alcohol.  Smoking can also make GERD/acid reflux worse.  Over the counter medications such as TUMS, Maalox or Mylanta, pepcid, Prilosec or Nexium may help with your symptoms.  Do not take Prilosec or Nexium if you are already prescribed a proton pump inhibitor.   You are being provided a prescription for opiates (also known as narcotics) for pain control.  Opiates can be addictive and should only be used when absolutely necessary for pain control when other alternatives do not work.  We recommend you only use them for the recommended amount of time and only as prescribed.  Please do not take with other sedative medications or alcohol.  Please do not drive, operate machinery, make important decisions while taking opiates.  Please note that these medications can be addictive and have high abuse potential.  Patients can become addicted to narcotics after only taking them for a few days.  Please keep these medications locked away from children, teenagers or any family members with history of substance abuse.  Narcotic pain medicine may also make you constipated.  You may use over-the-counter medications such as MiraLAX, Colace to prevent constipation.  If you become constipated, you may use over-the-counter enemas as needed.  Itching and nausea are also common side effects of narcotic pain medication.  If you develop uncontrolled vomiting or a rash, please stop these medications and seek medical care.

## 2022-09-13 NOTE — ED Notes (Signed)
ED Provider at bedside. 

## 2022-09-13 NOTE — ED Provider Notes (Signed)
[facility] Provider Note    Event Date/Time   First MD Initiated Contact with Patient 09/13/22 [phone]     (approximate)   History   Abdominal Pain   HPI  Suzanne Stout is a 47 y.o. female with history of hypertension, gastritis, duodenal ulcers who presents to the emergency department with complaints of upper abdominal pain, vomiting, bloody stools for the past several days.  Describes the bloody stools as dark red.  She did have some melena but states has not seen any blood or melena in the past day.  States this feels worse than her previous peptic ulcers.  She has had previous hysterectomy and bilateral salpingectomy.  No other abdominal surgeries.  No fevers.  Was here in the emergency department earlier today and treated for possible ulcers.  States pain and vomiting became worse tonight and that is what brought her back to the ER.   History provided by patient, significant other.    Past Medical History:  Diagnosis Date   Anxiety    Complication of anesthesia 2003   had a hard time waking    Hypertension    Psoriasis    RLS (restless legs syndrome)     Past Surgical History:  Procedure Laterality Date   BREAST LUMPECTOMY Right 2002   ESOPHAGOGASTRODUODENOSCOPY (EGD) WITH PROPOFOL N/A 01/19/2016   Procedure: ESOPHAGOGASTRODUODENOSCOPY (EGD) WITH PROPOFOL;  Surgeon: Wyline Mood, MD;  Location: [facility] ENDOSCOPY;  Service: Endoscopy;  Laterality: N/A;   LAPAROSCOPIC BILATERAL SALPINGECTOMY Bilateral 04/12/2020   Procedure: LAPAROSCOPIC BILATERAL SALPINGECTOMY; IUD INSERTION;  Surgeon: Vena Austria, MD;  Location: [facility] ORS;  Service: Gynecology;  Laterality: Bilateral;   LAPAROSCOPIC HYSTERECTOMY N/A 09/01/2020   Procedure: HYSTERECTOMY TOTAL LAPAROSCOPIC REMOVAL OF IUD AND CYSTOSCOPY;  Surgeon: Vena Austria, MD;  Location: [facility] ORS;  Service: Gynecology;  Laterality: N/A;   VAGINAL HYSTERECTOMY      MEDICATIONS:  Prior to Admission  medications   Medication Sig Start Date End Date Taking? Authorizing Provider  gabapentin (NEURONTIN) 100 MG capsule 1 tablet po once daily on day 1, 1 tablet po bid day 2, then 1 tablet po tid 10/11/20   Vena Austria, MD  lisinopril-hydrochlorothiazide (ZESTORETIC) 10-12.5 MG tablet Take 1 tablet by mouth every evening.    [provider]  Melatonin 10 MG TABS Take 10 mg by mouth at bedtime as needed (sleep.). Gummies    [provider]  omeprazole (PRILOSEC OTC) 20 MG tablet Take 1 tablet (20 mg total) by mouth in the morning and at bedtime for 14 days. 09/12/22 09/26/22  Corena Herter, MD  ondansetron (ZOFRAN-ODT) 4 MG disintegrating tablet Take 1 tablet (4 mg total) by mouth every 8 (eight) hours as needed for nausea or vomiting. 09/12/22   Corena Herter, MD  rOPINIRole (REQUIP) 0.25 MG tablet Take 0.25 mg by mouth at bedtime.    [provider]  scopolamine (TRANSDERM-SCOP) 1 MG/3DAYS 1 patch every 3 (three) days. 06/30/22   [provider]  sucralfate (CARAFATE) 1 g tablet Take 1 tablet (1 g total) by mouth 4 (four) times daily for 14 days. 09/12/22 09/26/22  Corena Herter, MD  Vitamin D, Ergocalciferol, (DRISDOL) 1.25 MG (50000 UNIT) CAPS capsule Take 50,000 Units by mouth once a week. 10/10/21   [provider]    Physical Exam   Triage Vital Signs: ED Triage Vitals  Encounter Vitals Group     BP 09/13/22 0334 (!) 112/59     Systolic BP Percentile --  Diastolic BP Percentile --      Pulse Rate 09/13/22 0334 68     Resp 09/13/22 0334 16     Temp 09/13/22 0341 98.3 F (36.8 C)     Temp Source 09/13/22 0341 Oral     SpO2 09/13/22 0334 97 %     Weight 09/13/22 0334 175 lb 4.3 oz (79.5 kg)     Height 09/13/22 0334 5\' 4"  (1.626 m)     Head Circumference --      Peak Flow --      Pain Score 09/13/22 0334 10     Pain Loc --      Pain Education --      Exclude from Growth Chart --     Most recent vital signs: Vitals:   09/13/22  0357 09/13/22 0535  BP:  103/67  Pulse: 95 97  Resp:  16  Temp:    SpO2: 97% 98%    CONSTITUTIONAL: Alert, responds appropriately to questions, appears uncomfortable HEAD: Normocephalic, atraumatic EYES: Conjunctivae clear, pupils appear equal, sclera nonicteric ENT: normal nose; moist mucous membranes NECK: Supple, normal ROM CARD: RRR; S1 and S2 appreciated RESP: Normal chest excursion without splinting or tachypnea; breath sounds clear and equal bilaterally; no wheezes, no rhonchi, no rales, no hypoxia or respiratory distress, speaking full sentences ABD/GI: Non-distended; soft, tender throughout the upper abdomen worse in the epigastric region, negative Murphy sign, no tenderness at McBurney's point RECTAL:  Normal rectal tone, no gross blood or melena, guaiac NEGATIVE, no hemorrhoids appreciated, nontender rectal exam, no fecal impaction.  BACK: The back appears normal EXT: Normal ROM in all joints; no deformity noted, no edema SKIN: Normal color for age and race; warm; no rash on exposed skin NEURO: Moves all extremities equally, normal speech PSYCH: The patient's mood and manner are appropriate.   ED Results / Procedures / Treatments   LABS: (all labs ordered are listed, but only abnormal results are displayed) Labs Reviewed  COMPREHENSIVE METABOLIC PANEL - Abnormal; Notable for the following components:      Result Value   Potassium 3.2 (*)    Glucose, Bld 126 (*)    Creatinine, Ser 1.10 (*)    All other components within normal limits  CBC - Abnormal; Notable for the following components:   WBC 17.5 (*)    All other components within normal limits  LIPASE, BLOOD     EKG:   RADIOLOGY: My personal review and interpretation of imaging: CT scan shows gastritis, enteritis.  I have personally reviewed all radiology reports.   CT ABDOMEN PELVIS W CONTRAST  Result Date: 09/13/2022 CLINICAL DATA:  Abdominal pain EXAM: CT ABDOMEN AND PELVIS WITH CONTRAST TECHNIQUE:  Multidetector CT imaging of the abdomen and pelvis was performed using the standard protocol following bolus administration of intravenous contrast. RADIATION DOSE REDUCTION: This exam was performed according to the departmental dose-optimization program which includes automated exposure control, adjustment of the mA and/or kV according to patient size and/or use of iterative reconstruction technique. CONTRAST:  OMNIPAQUE IOHEXOL 300 MG/ML  SOLN COMPARISON:  None Available. FINDINGS: Lower chest: No acute abnormality. Hepatobiliary: No focal liver abnormality is seen. No gallstones, gallbladder wall thickening, or biliary dilatation. Pancreas: Unremarkable. No pancreatic ductal dilatation or surrounding inflammatory changes. Spleen: Normal in size without focal abnormality. Adrenals/Urinary Tract: No nephrolithiasis, hydronephrosis, or suspicious mass. Mild asymmetric volume loss from the left kidney. Urinary bladder appears normal. Stomach/Bowel: There is diffuse low attenuation mural thickening involving the body and  antrum of the stomach., image 30/5. The wall thickness measures up to 1.5 cm. Two areas of suspected mucosal ulceration along the greater curvature of the stomach, image 35/2. no signs of perforation. There is no pathologic dilatation of the large or small bowel loops. Mild wall thickening involving pelvic small bowel loops and terminal ileum suspected without significant surrounding inflammatory fat stranding. Vascular/Lymphatic: Normal appearance of the abdominal aorta. No signs of abdominopelvic adenopathy. Reproductive: Status post hysterectomy.  No adnexal mass identified. Other: There is no ascites or focal fluid collections. No signs of pneumoperitoneum. Musculoskeletal: Within the left breast there is a 3.7 x 2.8 cm low-attenuation density, image 2/2.1 no acute or suspicious osseous findings. IMPRESSION: 1. There is diffuse low attenuation mural thickening involving the body and antrum  of the stomach. The wall thickness measures up to 1.5 cm. Two areas of suspected mucosal ulceration along the greater curvature of the stomach. No signs of perforation. Findings are concerning for gastritis. Consider further evaluation with endoscopy. 2. Mild wall thickening involving pelvic small bowel loops and terminal ileum suspected without significant surrounding inflammatory fat stranding. Findings may reflect mild enteritis. 3. Within the left breast there is a well-circumscribed, intermediate to low attenuation mass measuring 3.7 x 2.8 cm. Recommend correlation with mammography. Electronically Signed   By: Signa Kell M.D.   On: 09/13/2022 05:15     PROCEDURES:  Critical Care performed: No    Procedures    IMPRESSION / MDM / ASSESSMENT AND PLAN / ED COURSE  I reviewed the triage vital signs and the nursing notes.    Patient here for upper abdominal pain, vomiting, lower GI bleed.    DIFFERENTIAL DIAGNOSIS (includes but not limited to):   Gastritis, GERD, peptic ulcer disease, H. pylori, cholelithiasis, cholecystitis, pancreatitis, perforation, colitis   Patient's presentation is most consistent with acute presentation with potential threat to life or bodily function.   PLAN: Will repeat lab work.  Will obtain CT abdomen pelvis.  Will give IV fluids, pain and nausea medicine, Protonix.   MEDICATIONS GIVEN IN ED: Medications  promethazine (PHENERGAN) 25 mg in sodium chloride 0.9 % 50 mL IVPB (25 mg Intravenous New Bag/Given 09/13/22 0609)  sodium chloride 0.9 % bolus 1,000 mL (0 mLs Intravenous Stopped 09/13/22 0440)  morphine (PF) 4 MG/ML injection 4 mg (4 mg Intravenous Given 09/13/22 0408)  ondansetron (ZOFRAN) injection 4 mg (4 mg Intravenous Given 09/13/22 0408)  pantoprazole (PROTONIX) injection 40 mg (40 mg Intravenous Given 09/13/22 0408)  iohexol (OMNIPAQUE) 300 MG/ML solution 100 mL (100 mLs Intravenous Contrast Given 09/13/22 0426)  HYDROmorphone (DILAUDID)  injection 1 mg (1 mg Intravenous Given 09/13/22 0533)  sodium chloride 0.9 % bolus 1,000 mL (0 mLs Intravenous Stopped 09/13/22 0609)  alum & mag hydroxide-simeth (MAALOX/MYLANTA) 200-200-20 MG/5ML suspension 30 mL (30 mLs Oral Given 09/13/22 0608)     ED COURSE: Labs now show leukocytosis of 17,000.  Normal LFTs and lipase.  CT of the abdomen pelvis shows mural thickening in the body and the antrum of the stomach with 2 areas of suspected ulceration along the greater curvature of the stomach concerning for gastritis, gastric ulcer without perforation.  She also has some signs of enteritis.  Incidentally she also has a mass in the left breast.  We have discussed this and the need for mammography as an outpatient.  Patient still in significant pain and dry heaving.  Will give another round of pain and nausea medicine and IV fluids and reassess.  6:20 AM  Pt feeling better after additional pain medication.  Have offered admission but patient and husband would prefer discharge home with follow-up with their gastroenterologist.  Will discharge with course of narcotic analgesia.  She has already been prescribed Protonix and Zofran.  Will prescribe Phenergan as well.  Recommended bland diet and avoiding NSAIDs, spicy and acidic foods.  She has no signs of GI bleeding at this time and has not had hematemesis, coffee-ground emesis.   At this time, I do not feel there is any life-threatening condition present. I reviewed all nursing notes, vitals, pertinent previous records.  All lab and urine results, EKGs, imaging ordered have been independently reviewed and interpreted by myself.  I reviewed all available radiology reports from any imaging ordered this visit.  Based on my assessment, I feel the patient is safe to be discharged home without further emergent workup and can continue workup as an outpatient as needed. Discussed all findings, treatment plan as well as usual and customary return precautions.  They  verbalize understanding and are comfortable with this plan.  Outpatient follow-up has been provided as needed.  All questions have been answered.   CONSULTS: Offered admission but patient declined.   OUTSIDE RECORDS REVIEWED: Reviewed EGD in 2018 and patient had normal esophagus, gastritis and multiple nonbleeding duodenal ulcers.       FINAL CLINICAL IMPRESSION(S) / ED DIAGNOSES   Final diagnoses:  Gastritis without bleeding, unspecified chronicity, unspecified gastritis type  Peptic ulcer     Rx / DC Orders   ED Discharge Orders          Ordered    oxyCODONE-acetaminophen (PERCOCET) 5-325 MG tablet  Every 6 hours PRN        09/13/22 0620    promethazine (PHENERGAN) 25 MG tablet  Every 6 hours PRN        09/13/22 0620             Note:  This document was prepared using Dragon voice recognition software and may include unintentional dictation errors.   Bomani Oommen, Layla Maw, DO 09/13/22 [phone]

## 2022-09-13 NOTE — ED Notes (Signed)
Patient transported to CT 

## 2022-09-20 DIAGNOSIS — Z1231 Encounter for screening mammogram for malignant neoplasm of breast: Secondary | ICD-10-CM

## 2022-09-24 ENCOUNTER — Other Ambulatory Visit: Payer: Self-pay

## 2022-09-25 NOTE — Progress Notes (Unsigned)
Celso Amy, PA-C [address]  [address]  [place], [place] [address]  Main: [phone]  Fax: [phone]   Gastroenterology Consultation  Referring Provider:     Lannie Fields, MD Primary Care Physician:  Lannie Fields, MD Primary Gastroenterologist:   Celso Amy, PA-C / Dr. Wyline Mood. Reason for Consultation:     Gastritis, Ulcers, abdominal pain, enteritis        HPI:   QUINNLYN PACIONE is a 47 y.o. y/o female referred for consultation & management  by Lannie Fields, MD.    Last EGD done by Dr. Tobi Bastos 01/2016 showed 2 nonbleeding "kissing" duodenal ulcers (Forrest class III) and moderate erosive gastritis.  Biopsies negative for H. Pylori.  She was started on Prilosec 40 Mg daily and told to repeat EGD in 6 weeks, however she was lost to follow-up after that.  She went to [facility] ED 09/12/2022 and 09/13/2022 to evaluate recurrent severe upper abdominal pain, nausea, vomiting, and bloody diarrhea.  Labs showed low potassium 3.2, elevated WBC 17.5, normal hemoglobin 12.7, negative COVID test.    Abdominal pelvic CT 09/13/2022 showed diffuse thickening of the stomach and 2 areas of mucosal ulceration in the greater curvature of the stomach.  No perforation.  There was also mild wall thickening involving the pelvic small bowel loops and terminal ileum concerning for enteritis.  She was advised to follow-up with GI.  Past Medical History:  Diagnosis Date   Anxiety    Complication of anesthesia 2003   had a hard time waking    Hypertension    Psoriasis    RLS (restless legs syndrome)     Past Surgical History:  Procedure Laterality Date   BREAST LUMPECTOMY Right 2002   ESOPHAGOGASTRODUODENOSCOPY (EGD) WITH PROPOFOL N/A 01/19/2016   Procedure: ESOPHAGOGASTRODUODENOSCOPY (EGD) WITH PROPOFOL;  Surgeon: Wyline Mood, MD;  Location: [facility] ENDOSCOPY;  Service: Endoscopy;  Laterality: N/A;   LAPAROSCOPIC BILATERAL SALPINGECTOMY Bilateral 04/12/2020   Procedure: LAPAROSCOPIC  BILATERAL SALPINGECTOMY; IUD INSERTION;  Surgeon: Vena Austria, MD;  Location: [facility] ORS;  Service: Gynecology;  Laterality: Bilateral;   LAPAROSCOPIC HYSTERECTOMY N/A 09/01/2020   Procedure: HYSTERECTOMY TOTAL LAPAROSCOPIC REMOVAL OF IUD AND CYSTOSCOPY;  Surgeon: Vena Austria, MD;  Location: [facility] ORS;  Service: Gynecology;  Laterality: N/A;   VAGINAL HYSTERECTOMY      Prior to Admission medications   Medication Sig Start Date End Date Taking? Authorizing Provider  gabapentin (NEURONTIN) 100 MG capsule 1 tablet po once daily on day 1, 1 tablet po bid day 2, then 1 tablet po tid 10/11/20   Vena Austria, MD  lisinopril-hydrochlorothiazide (ZESTORETIC) 10-12.5 MG tablet Take 1 tablet by mouth every evening.    [provider]  omeprazole (PRILOSEC OTC) 20 MG tablet Take 1 tablet (20 mg total) by mouth in the morning and at bedtime for 14 days. 09/12/22 09/26/22  Corena Herter, MD  ondansetron (ZOFRAN-ODT) 4 MG disintegrating tablet Take 1 tablet (4 mg total) by mouth every 8 (eight) hours as needed for nausea or vomiting. 09/12/22   Corena Herter, MD  oxyCODONE-acetaminophen (PERCOCET) 5-325 MG tablet Take 2 tablets by mouth every 6 (six) hours as needed for severe pain. 09/13/22 09/13/23  Ward, Layla Maw, DO  promethazine (PHENERGAN) 25 MG tablet Take 1 tablet (25 mg total) by mouth every 6 (six) hours as needed for nausea or vomiting. 09/13/22   Ward, Layla Maw, DO  rOPINIRole (REQUIP) 0.25 MG tablet Take 0.25 mg by mouth at bedtime.    [provider]  scopolamine (TRANSDERM-SCOP) 1 MG/3DAYS 1 patch every 3 (three) days. 06/30/22   [provider]  Semaglutide-Weight Management (WEGOVY) 0.5 MG/0.5ML SOAJ Inject into the skin. 03/15/22   [provider]  sucralfate (CARAFATE) 1 g tablet Take 1 tablet (1 g total) by mouth 4 (four) times daily for 14 days. 09/12/22 09/26/22  Corena Herter, MD  zolpidem (AMBIEN) 10 MG tablet Take by mouth.    [provider]    Family History  Problem Relation Age of Onset   Gallbladder disease Other    Hypertension Mother    Alcoholism Father      Social History   Tobacco Use   Smoking status: Never   Smokeless tobacco: Never  Vaping Use   Vaping status: Never Used  Substance Use Topics   Alcohol use: No   Drug use: Never    Allergies as of 09/26/2022 - Review Complete 09/13/2022  Allergen Reaction Noted   Latex Dermatitis and Rash 08/18/2013    Review of Systems:    All systems reviewed and negative except where noted in HPI.   Physical Exam:  There were no vitals taken for this visit. No LMP recorded. Patient has had a hysterectomy.  General:   Alert,  Well-developed, well-nourished, pleasant and cooperative in NAD Lungs:  Respirations even and unlabored.  Clear throughout to auscultation.   No wheezes, crackles, or rhonchi. No acute distress. Heart:  Regular rate and rhythm; no murmurs, clicks, rubs, or gallops. Abdomen:  Normal bowel sounds.  No bruits.  Soft, and non-distended without masses, hepatosplenomegaly or hernias noted.  No Tenderness.  No guarding or rebound tenderness.    Neurologic:  Alert and oriented x3;  grossly normal neurologically. Psych:  Alert and cooperative. Normal mood and affect.  Imaging Studies: CT ABDOMEN PELVIS W CONTRAST  Result Date: 09/13/2022 CLINICAL DATA:  Abdominal pain EXAM: CT ABDOMEN AND PELVIS WITH CONTRAST TECHNIQUE: Multidetector CT imaging of the abdomen and pelvis was performed using the standard protocol following bolus administration of intravenous contrast. RADIATION DOSE REDUCTION: This exam was performed according to the departmental dose-optimization program which includes automated exposure control, adjustment of the mA and/or kV according to patient size and/or use of iterative reconstruction technique. CONTRAST:  OMNIPAQUE IOHEXOL 300 MG/ML  SOLN COMPARISON:  None Available. FINDINGS: Lower chest: No acute  abnormality. Hepatobiliary: No focal liver abnormality is seen. No gallstones, gallbladder wall thickening, or biliary dilatation. Pancreas: Unremarkable. No pancreatic ductal dilatation or surrounding inflammatory changes. Spleen: Normal in size without focal abnormality. Adrenals/Urinary Tract: No nephrolithiasis, hydronephrosis, or suspicious mass. Mild asymmetric volume loss from the left kidney. Urinary bladder appears normal. Stomach/Bowel: There is diffuse low attenuation mural thickening involving the body and antrum of the stomach., image 30/5. The wall thickness measures up to 1.5 cm. Two areas of suspected mucosal ulceration along the greater curvature of the stomach, image 35/2. no signs of perforation. There is no pathologic dilatation of the large or small bowel loops. Mild wall thickening involving pelvic small bowel loops and terminal ileum suspected without significant surrounding inflammatory fat stranding. Vascular/Lymphatic: Normal appearance of the abdominal aorta. No signs of abdominopelvic adenopathy. Reproductive: Status post hysterectomy.  No adnexal mass identified. Other: There is no ascites or focal fluid collections. No signs of pneumoperitoneum. Musculoskeletal: Within the left breast there is a 3.7 x 2.8 cm low-attenuation density, image 2/2.1 no acute or suspicious osseous findings. IMPRESSION: 1. There is diffuse low attenuation mural thickening involving the body  and antrum of the stomach. The wall thickness measures up to 1.5 cm. Two areas of suspected mucosal ulceration along the greater curvature of the stomach. No signs of perforation. Findings are concerning for gastritis. Consider further evaluation with endoscopy. 2. Mild wall thickening involving pelvic small bowel loops and terminal ileum suspected without significant surrounding inflammatory fat stranding. Findings may reflect mild enteritis. 3. Within the left breast there is a well-circumscribed, intermediate to low  attenuation mass measuring 3.7 x 2.8 cm. Recommend correlation with mammography. Electronically Signed   By: Signa Kell M.D.   On: 09/13/2022 05:15    Assessment and Plan:   DANNALY ARAI is a 47 y.o. y/o female has been referred for   1.  Upper abdominal pain  H. pylori breath test  2.  Abnormal CT scan of the stomach and small intestine  Scheduling EGD & Colonoscopy I discussed risks of EGD and colonoscopy with patient to include risk of bleeding, perforation, and risk of sedation.  Patient expressed understanding and agrees to proceed with procedures.  She has been  3.  Peptic ulcer disease; history of duodenal ulcers and erosive gastritis  Schedule repeat EGD  Avoid NSAIDs  Start PPI  4.  Leukocytosis  Lab CBC  5.  Hypokalemia  Lab BMP  6.  Rectal bleeding  Schedule colonoscopy; evaluate for IBD    Follow up ***  Celso Amy, PA-C    BP check ***

## 2022-09-26 ENCOUNTER — Ambulatory Visit (INDEPENDENT_AMBULATORY_CARE_PROVIDER_SITE_OTHER): Payer: Managed Care, Other (non HMO) | Admitting: Physician Assistant

## 2022-09-26 ENCOUNTER — Encounter: Payer: Self-pay | Admitting: Physician Assistant

## 2022-09-26 VITALS — BP 90/60 | HR 90 | Temp 97.7°F | Ht 64.5 in | Wt 172.2 lb

## 2022-09-26 DIAGNOSIS — R933 Abnormal findings on diagnostic imaging of other parts of digestive tract: Secondary | ICD-10-CM | POA: Diagnosis not present

## 2022-09-26 DIAGNOSIS — R1013 Epigastric pain: Secondary | ICD-10-CM | POA: Diagnosis not present

## 2022-09-26 DIAGNOSIS — K625 Hemorrhage of anus and rectum: Secondary | ICD-10-CM

## 2022-09-26 DIAGNOSIS — E876 Hypokalemia: Secondary | ICD-10-CM

## 2022-09-26 DIAGNOSIS — R1030 Lower abdominal pain, unspecified: Secondary | ICD-10-CM

## 2022-09-26 DIAGNOSIS — K279 Peptic ulcer, site unspecified, unspecified as acute or chronic, without hemorrhage or perforation: Secondary | ICD-10-CM

## 2022-09-26 DIAGNOSIS — Z8719 Personal history of other diseases of the digestive system: Secondary | ICD-10-CM

## 2022-09-26 DIAGNOSIS — D72829 Elevated white blood cell count, unspecified: Secondary | ICD-10-CM

## 2022-09-27 ENCOUNTER — Other Ambulatory Visit: Payer: Self-pay | Admitting: Family Medicine

## 2022-09-27 DIAGNOSIS — N63 Unspecified lump in unspecified breast: Secondary | ICD-10-CM

## 2022-09-27 LAB — CBC WITH DIFFERENTIAL/PLATELET
Basophils Absolute: 0.1 10*3/uL (ref 0.0–0.2)
Basos: 1 %
EOS (ABSOLUTE): 0.3 10*3/uL (ref 0.0–0.4)
Eos: 4 %
Hematocrit: 39.1 % (ref 34.0–46.6)
Hemoglobin: 13 g/dL (ref 11.1–15.9)
Immature Grans (Abs): 0 10*3/uL (ref 0.0–0.1)
Immature Granulocytes: 0 %
Lymphocytes Absolute: 2.1 10*3/uL (ref 0.7–3.1)
Lymphs: 26 %
MCH: 31.7 pg (ref 26.6–33.0)
MCHC: 33.2 g/dL (ref 31.5–35.7)
MCV: 95 fL (ref 79–97)
Monocytes Absolute: 0.4 10*3/uL (ref 0.1–0.9)
Monocytes: 5 %
Neutrophils Absolute: 5.1 10*3/uL (ref 1.4–7.0)
Neutrophils: 64 %
Platelets: 370 10*3/uL (ref 150–450)
RBC: 4.1 x10E6/uL (ref 3.77–5.28)
RDW: 11.8 % (ref 11.7–15.4)
WBC: 8 10*3/uL (ref 3.4–10.8)

## 2022-09-27 LAB — BASIC METABOLIC PANEL
BUN/Creatinine Ratio: 9 (ref 9–23)
BUN: 12 mg/dL (ref 6–24)
CO2: 22 mmol/L (ref 20–29)
Calcium: 10.1 mg/dL (ref 8.7–10.2)
Chloride: 103 mmol/L (ref 96–106)
Creatinine, Ser: 1.3 mg/dL — ABNORMAL HIGH (ref 0.57–1.00)
Glucose: 76 mg/dL (ref 70–99)
Potassium: 3.9 mmol/L (ref 3.5–5.2)
Sodium: 143 mmol/L (ref 134–144)
eGFR: 52 mL/min/{1.73_m2} — ABNORMAL LOW (ref >59)

## 2022-09-27 LAB — C-REACTIVE PROTEIN: CRP: <1 mg/L (ref 0–10)

## 2022-10-05 ENCOUNTER — Other Ambulatory Visit: Payer: Self-pay | Admitting: Family Medicine

## 2022-10-05 ENCOUNTER — Ambulatory Visit
Admission: RE | Admit: 2022-10-05 | Discharge: 2022-10-05 | Disposition: A | Discharge status: Home / Self Care | Payer: Managed Care, Other (non HMO) | Source: Ambulatory Visit | Attending: Family Medicine | Admitting: Family Medicine

## 2022-10-05 DIAGNOSIS — N6002 Solitary cyst of left breast: Secondary | ICD-10-CM | POA: Diagnosis not present

## 2022-10-05 DIAGNOSIS — N63 Unspecified lump in unspecified breast: Secondary | ICD-10-CM

## 2022-10-05 DIAGNOSIS — N6315 Unspecified lump in the right breast, overlapping quadrants: Secondary | ICD-10-CM | POA: Diagnosis not present

## 2022-10-05 DIAGNOSIS — N6342 Unspecified lump in left breast, subareolar: Secondary | ICD-10-CM | POA: Diagnosis not present

## 2022-10-08 ENCOUNTER — Encounter: Payer: Self-pay | Admitting: Family Medicine

## 2022-10-09 ENCOUNTER — Other Ambulatory Visit: Payer: Self-pay

## 2022-10-09 ENCOUNTER — Other Ambulatory Visit: Payer: Self-pay | Admitting: General Practice

## 2022-10-09 ENCOUNTER — Encounter: Payer: Self-pay | Admitting: General Practice

## 2022-10-09 DIAGNOSIS — R928 Other abnormal and inconclusive findings on diagnostic imaging of breast: Secondary | ICD-10-CM

## 2022-10-09 DIAGNOSIS — R1013 Epigastric pain: Secondary | ICD-10-CM

## 2022-10-09 DIAGNOSIS — N63 Unspecified lump in unspecified breast: Secondary | ICD-10-CM

## 2022-10-09 NOTE — Progress Notes (Signed)
Last office visit plan from 09/26/2022 Peptic ulcer disease; history of duodenal ulcers and erosive gastritis             Schedule repeat EGD             Avoid NSAIDs; okay to take Tylenol or acetaminophen if needed.             H. Pylori Breath Test after she has been off Prilosec PPI for 2 weeks.             After she has done H. pylori breath test, then she can restart Prilosec 20 Mg once daily.

## 2022-10-12 LAB — H. PYLORI BREATH TEST: H pylori Breath Test: NEGATIVE

## 2022-10-17 ENCOUNTER — Ambulatory Visit
Admission: RE | Admit: 2022-10-17 | Discharge: 2022-10-17 | Disposition: A | Discharge status: Home / Self Care | Payer: Managed Care, Other (non HMO) | Source: Ambulatory Visit | Attending: General Practice | Admitting: General Practice

## 2022-10-17 ENCOUNTER — Inpatient Hospital Stay
Admission: RE | Admit: 2022-10-17 | Discharge: 2022-10-17 | Disposition: A | Discharge status: Home / Self Care | Payer: Managed Care, Other (non HMO) | Source: Ambulatory Visit | Attending: General Practice | Admitting: General Practice

## 2022-10-17 DIAGNOSIS — R928 Other abnormal and inconclusive findings on diagnostic imaging of breast: Secondary | ICD-10-CM | POA: Insufficient documentation

## 2022-10-17 DIAGNOSIS — N63 Unspecified lump in unspecified breast: Secondary | ICD-10-CM

## 2022-10-17 DIAGNOSIS — N62 Hypertrophy of breast: Secondary | ICD-10-CM | POA: Diagnosis present

## 2022-10-17 DIAGNOSIS — N6489 Other specified disorders of breast: Secondary | ICD-10-CM | POA: Insufficient documentation

## 2022-10-17 HISTORY — PX: BREAST BIOPSY: SHX20

## 2022-10-17 MED ORDER — LIDOCAINE HCL 1 % IJ SOLN
10.0000 mL | Freq: Once | INTRAMUSCULAR | Status: AC
  Administered 2022-10-17: 10 mL
  Filled 2022-10-17: qty 10

## 2022-10-17 MED ORDER — LIDOCAINE-EPINEPHRINE 1 %-1:100000 IJ SOLN
10.0000 mL | Freq: Once | INTRAMUSCULAR | Status: AC
  Administered 2022-10-17: 10 mL
  Filled 2022-10-17: qty 10

## 2022-10-17 MED ORDER — LIDOCAINE 1 % OPTIME INJ - NO CHARGE
5.0000 mL | Freq: Once | INTRAMUSCULAR | Status: AC
  Administered 2022-10-17: 5 mL
  Filled 2022-10-17: qty 6

## 2022-10-18 LAB — SURGICAL PATHOLOGY

## 2022-10-19 ENCOUNTER — Encounter: Payer: Self-pay | Admitting: *Deleted

## 2022-10-19 NOTE — Progress Notes (Signed)
Referral recieved from [facility] for benign breast mass. She would like to see Dr. Isla Pence at [facility] breast clinic.  Referral, path, demographics faxed to [facility] at [phone].   No further needs at this time.

## 2022-10-23 ENCOUNTER — Encounter: Payer: Self-pay | Admitting: *Deleted

## 2022-10-23 ENCOUNTER — Ambulatory Visit: Payer: Managed Care, Other (non HMO) | Admitting: Physician Assistant

## 2022-10-23 NOTE — Progress Notes (Signed)
Suzanne Stout called and stated [facility] said they had not received the referral.  Referral was sent on 10/4 with fax confirmation that it went through.   I went ahead and re-faxed the referral and demographics with another fax confirmation it was successfully transmitted.

## 2022-11-13 ENCOUNTER — Encounter
Admission: RE | Disposition: A | Discharge status: Home / Self Care | Payer: Self-pay | Source: Home / Self Care | Attending: Gastroenterology

## 2022-11-13 ENCOUNTER — Other Ambulatory Visit: Payer: Self-pay

## 2022-11-13 ENCOUNTER — Ambulatory Visit
Admission: RE | Admit: 2022-11-13 | Discharge: 2022-11-13 | Disposition: A | Discharge status: Home / Self Care | Payer: Managed Care, Other (non HMO) | Attending: Gastroenterology | Admitting: Gastroenterology

## 2022-11-13 ENCOUNTER — Ambulatory Visit: Payer: Managed Care, Other (non HMO) | Admitting: Anesthesiology

## 2022-11-13 ENCOUNTER — Encounter: Payer: Self-pay | Admitting: Gastroenterology

## 2022-11-13 DIAGNOSIS — K2970 Gastritis, unspecified, without bleeding: Secondary | ICD-10-CM

## 2022-11-13 DIAGNOSIS — K640 First degree hemorrhoids: Secondary | ICD-10-CM | POA: Insufficient documentation

## 2022-11-13 DIAGNOSIS — K2960 Other gastritis without bleeding: Secondary | ICD-10-CM | POA: Diagnosis not present

## 2022-11-13 DIAGNOSIS — K259 Gastric ulcer, unspecified as acute or chronic, without hemorrhage or perforation: Secondary | ICD-10-CM | POA: Insufficient documentation

## 2022-11-13 DIAGNOSIS — K625 Hemorrhage of anus and rectum: Secondary | ICD-10-CM | POA: Insufficient documentation

## 2022-11-13 DIAGNOSIS — Z79899 Other long term (current) drug therapy: Secondary | ICD-10-CM | POA: Insufficient documentation

## 2022-11-13 DIAGNOSIS — R1013 Epigastric pain: Secondary | ICD-10-CM | POA: Diagnosis not present

## 2022-11-13 DIAGNOSIS — I10 Essential (primary) hypertension: Secondary | ICD-10-CM | POA: Insufficient documentation

## 2022-11-13 HISTORY — PX: COLONOSCOPY WITH PROPOFOL: SHX5780

## 2022-11-13 HISTORY — PX: ESOPHAGOGASTRODUODENOSCOPY (EGD) WITH PROPOFOL: SHX5813

## 2022-11-13 SURGERY — ESOPHAGOGASTRODUODENOSCOPY (EGD) WITH PROPOFOL
Anesthesia: General

## 2022-11-13 MED ORDER — ONDANSETRON HCL 4 MG/2ML IJ SOLN
INTRAMUSCULAR | Status: AC
  Filled 2022-11-13: qty 2

## 2022-11-13 MED ORDER — DEXMEDETOMIDINE HCL IN NACL 80 MCG/20ML IV SOLN
INTRAVENOUS | Status: DC | PRN
  Administered 2022-11-13: 8 ug via INTRAVENOUS
  Administered 2022-11-13: 4 ug via INTRAVENOUS

## 2022-11-13 MED ORDER — PROPOFOL 10 MG/ML IV BOLUS
INTRAVENOUS | Status: DC | PRN
  Administered 2022-11-13: 130 ug/kg/min via INTRAVENOUS
  Administered 2022-11-13: 100 mg via INTRAVENOUS

## 2022-11-13 MED ORDER — OMEPRAZOLE MAGNESIUM 20 MG PO TBEC: 40.0000 mg | DELAYED_RELEASE_TABLET | Freq: Two times a day (BID) | ORAL | 2 refills | Status: DC

## 2022-11-13 MED ORDER — ONDANSETRON HCL 4 MG/2ML IJ SOLN
4.0000 mg | Freq: Once | INTRAMUSCULAR | Status: AC
  Administered 2022-11-13: 4 mg via INTRAVENOUS

## 2022-11-13 MED ORDER — SODIUM CHLORIDE 0.9 % IV SOLN: INTRAVENOUS | Status: DC

## 2022-11-13 NOTE — Anesthesia Preprocedure Evaluation (Signed)
Anesthesia Evaluation  Patient identified by MRN, date of birth, ID band Patient awake    Reviewed: Allergy & Precautions, NPO status , Patient's Chart, lab work & pertinent test results  History of Anesthesia Complications Negative for: history of anesthetic complications  Airway Mallampati: III  TM Distance: <3 FB Neck ROM: full    Dental  (+) Chipped Braces:   Pulmonary neg pulmonary ROS, neg shortness of breath   Pulmonary exam normal        Cardiovascular Exercise Tolerance: Good hypertension, Normal cardiovascular exam     Neuro/Psych  PSYCHIATRIC DISORDERS      negative neurological ROS     GI/Hepatic negative GI ROS, Neg liver ROS,neg GERD  ,,  Endo/Other  negative endocrine ROS    Renal/GU negative Renal ROS  negative genitourinary   Musculoskeletal   Abdominal   Peds  Hematology negative hematology ROS (+)   Anesthesia Other Findings Past Medical History: No date: Anxiety 2003: Complication of anesthesia     Comment:  had a hard time waking  No date: Hypertension No date: Psoriasis No date: RLS (restless legs syndrome)  Past Surgical History: 10/17/2022: BREAST BIOPSY; Right     Comment:  affirm bx x marker path -pending 10/17/2022: BREAST BIOPSY; Left     Comment:  stereo bx, distortion, RIBBON clip-path pending 10/17/2022: BREAST BIOPSY; Right     Comment:  MM RT BREAST BX W LOC DEV EA AD LESION IMG BX SPEC               STEREO GUIDE 10/17/2022 [facility] 10/17/2022: BREAST BIOPSY; Left     Comment:  MM LT BREAST BX W LOC DEV 1ST LESION IMAGE BX SPEC               STEREO GUIDE 10/17/2022 [facility] 2002: BREAST LUMPECTOMY; Right 01/19/2016: ESOPHAGOGASTRODUODENOSCOPY (EGD) WITH PROPOFOL; N/A     Comment:  Procedure: ESOPHAGOGASTRODUODENOSCOPY (EGD) WITH               PROPOFOL;  Surgeon: Wyline Mood, MD;  Location: [facility]               ENDOSCOPY;  Service: Endoscopy;  Laterality:  N/A; 04/12/2020: LAPAROSCOPIC BILATERAL SALPINGECTOMY; Bilateral     Comment:  Procedure: LAPAROSCOPIC BILATERAL SALPINGECTOMY; IUD               INSERTION;  Surgeon: Vena Austria, MD;  Location:               [facility] ORS;  Service: Gynecology;  Laterality: Bilateral; 09/01/2020: LAPAROSCOPIC HYSTERECTOMY; N/A     Comment:  Procedure: HYSTERECTOMY TOTAL LAPAROSCOPIC REMOVAL OF               IUD AND CYSTOSCOPY;  Surgeon: Vena Austria, MD;                Location: [facility] ORS;  Service: Gynecology;  Laterality:               N/A; No date: VAGINAL HYSTERECTOMY     Reproductive/Obstetrics negative OB ROS                             Anesthesia Physical Anesthesia Plan  ASA: 2  Anesthesia Plan: General   Post-op Pain Management:    Induction: Intravenous  PONV Risk Score and Plan: Propofol infusion and TIVA  Airway Management Planned: Natural Airway and Nasal Cannula  Additional Equipment:   Intra-op Plan:  Post-operative Plan:   Informed Consent: I have reviewed the patients History and Physical, chart, labs and discussed the procedure including the risks, benefits and alternatives for the proposed anesthesia with the patient or authorized representative who has indicated his/her understanding and acceptance.     Dental Advisory Given  Plan Discussed with: Anesthesiologist, CRNA and Surgeon  Anesthesia Plan Comments: (Patient consented for risks of anesthesia including but not limited to:  - adverse reactions to medications - risk of airway placement if required - damage to eyes, teeth, lips or other oral mucosa - nerve damage due to positioning  - sore throat or hoarseness - Damage to heart, brain, nerves, lungs, other parts of body or loss of life  Patient voiced understanding and assent.)       Anesthesia Quick Evaluation

## 2022-11-13 NOTE — Transfer of Care (Signed)
Immediate Anesthesia Transfer of Care Note  Patient: Suzanne Stout  Procedure(s) Performed: ESOPHAGOGASTRODUODENOSCOPY (EGD) WITH PROPOFOL COLONOSCOPY WITH PROPOFOL  Patient Location: PACU and Endoscopy Unit  Anesthesia Type:General  Level of Consciousness: drowsy and patient cooperative  Airway & Oxygen Therapy: Patient Spontanous Breathing  Post-op Assessment: Report given to RN and Post -op Vital signs reviewed and stable  Post vital signs: Reviewed and stable  Last Vitals:  Vitals Value Taken Time  BP 94/64 11/13/22 0922  Temp 35.9 C 11/13/22 0922  Pulse 76 11/13/22 0922  Resp 20 11/13/22 0922  SpO2 100 % 11/13/22 0922    Last Pain:  Vitals:   11/13/22 0922  TempSrc: Temporal  PainSc: Asleep         Complications: No notable events documented.

## 2022-11-13 NOTE — H&P (Signed)
Wyline Mood, MD [address], [address], [place], [place], [address] [address], [address], [place], [place], [address] Phone: [phone]  Fax: [phone]  Primary Care Physician:  Lannie Fields, MD   Pre-Procedure History & Physical: HPI:  Suzanne Stout is a 47 y.o. female is here for an endoscopy and colonoscopy    Past Medical History:  Diagnosis Date   Anxiety    Complication of anesthesia 2003   had a hard time waking    Hypertension    Psoriasis    RLS (restless legs syndrome)     Past Surgical History:  Procedure Laterality Date   BREAST BIOPSY Right 10/17/2022   affirm bx x marker path -pending   BREAST BIOPSY Left 10/17/2022   stereo bx, distortion, RIBBON clip-path pending   BREAST BIOPSY Right 10/17/2022   MM RT BREAST BX W LOC DEV EA AD LESION IMG BX SPEC STEREO GUIDE 10/17/2022 [facility]   BREAST BIOPSY Left 10/17/2022   MM LT BREAST BX W LOC DEV 1ST LESION IMAGE BX SPEC STEREO GUIDE 10/17/2022 [facility]   BREAST LUMPECTOMY Right 2002   ESOPHAGOGASTRODUODENOSCOPY (EGD) WITH PROPOFOL N/A 01/19/2016   Procedure: ESOPHAGOGASTRODUODENOSCOPY (EGD) WITH PROPOFOL;  Surgeon: Wyline Mood, MD;  Location: [facility];  Service: Endoscopy;  Laterality: N/A;   LAPAROSCOPIC BILATERAL SALPINGECTOMY Bilateral 04/12/2020   Procedure: LAPAROSCOPIC BILATERAL SALPINGECTOMY; IUD INSERTION;  Surgeon: Vena Austria, MD;  Location: [facility];  Service: Gynecology;  Laterality: Bilateral;   LAPAROSCOPIC HYSTERECTOMY N/A 09/01/2020   Procedure: HYSTERECTOMY TOTAL LAPAROSCOPIC REMOVAL OF IUD AND CYSTOSCOPY;  Surgeon: Vena Austria, MD;  Location: [facility];  Service: Gynecology;  Laterality: N/A;   VAGINAL HYSTERECTOMY      Prior to Admission medications   Medication Sig Start Date End Date Taking? Authorizing Provider  lisinopril-hydrochlorothiazide (ZESTORETIC) 10-12.5 MG tablet Take 1 tablet by mouth every evening.   Yes [provider]   omeprazole (PRILOSEC OTC) 20 MG tablet Take 1 tablet (20 mg total) by mouth in the morning and at bedtime for 14 days. 09/12/22 11/13/22 Yes Mumma, Carollee Herter, MD  rOPINIRole (REQUIP) 0.25 MG tablet Take 0.25 mg by mouth at bedtime.   Yes [provider]  sucralfate (CARAFATE) 1 g tablet Take 1 tablet (1 g total) by mouth 4 (four) times daily for 14 days. 09/12/22 11/13/22 Yes Mumma, Carollee Herter, MD  ondansetron (ZOFRAN-ODT) 4 MG disintegrating tablet Take 1 tablet (4 mg total) by mouth every 8 (eight) hours as needed for nausea or vomiting. 09/12/22   Corena Herter, MD  promethazine (PHENERGAN) 25 MG tablet Take 1 tablet (25 mg total) by mouth every 6 (six) hours as needed for nausea or vomiting. 09/13/22   Ward, Layla Maw, DO  scopolamine (TRANSDERM-SCOP) 1 MG/3DAYS 1 patch every 3 (three) days. 06/30/22   [provider]    Allergies as of 09/26/2022 - Review Complete 09/26/2022  Allergen Reaction Noted   Latex Dermatitis and Rash 08/18/2013    Family History  Problem Relation Age of Onset   Hypertension Mother    Alcoholism Father    Breast cancer Maternal Aunt 72 - 58   Gallbladder disease Other     Social History   Socioeconomic History   Marital status: Married    Spouse name: Not on file   Number of children: Not on file   Years of education: Not on file   Highest education level: Not on file  Occupational History   Not on file  Tobacco Use  Smoking status: Never   Smokeless tobacco: Never  Vaping Use   Vaping status: Never Used  Substance and Sexual Activity   Alcohol use: No   Drug use: Never   Sexual activity: Yes    Birth control/protection: Surgical    Comment: Hysterectomy  Other Topics Concern   Not on file  Social History Narrative   Lives. with husband   Social Determinants of Corporate investment banker Strain: Not on file  Food Insecurity: Not on file  Transportation Needs: Not on file  Physical Activity: Not on file  Stress: Not on  file  Social Connections: Not on file  Intimate Partner Violence: Not on file    Review of Systems: See HPI, otherwise negative ROS  Physical Exam: BP 104/72   Pulse 93   Temp (!) 97 F (36.1 C)   Wt 74.4 kg   SpO2 100%   BMI 27.72 kg/m  General:   Alert,  pleasant and cooperative in NAD Head:  Normocephalic and atraumatic. Neck:  Supple; no masses or thyromegaly. Lungs:  Clear throughout to auscultation, normal respiratory effort.    Heart:  +S1, +S2, Regular rate and rhythm, No edema. Abdomen:  Soft, nontender and nondistended. Normal bowel sounds, without guarding, and without rebound.   Neurologic:  Alert and  oriented x4;  grossly normal neurologically.  Impression/Plan: Suzanne Stout is here for an endoscopy and colonoscopy  to be performed for  evaluation of abdominal pain     Risks, benefits, limitations, and alternatives regarding endoscopy have been reviewed with the patient.  Questions have been answered.  All parties agreeable.   Wyline Mood, MD  11/13/2022, 8:23 AM

## 2022-11-13 NOTE — Op Note (Signed)
[facility] Patient Name: Suzanne Stout Procedure Date: 11/13/2022 8:54 AM MRN: [id number] Account #: [id number] Date of Birth: 1975/12/13 Admit Type: Outpatient Age: 47 Room: [facility] ENDO ROOM 2 Gender: Female Note Status: Finalized Instrument Name: Upper Endoscope [id number] Procedure:             Upper GI endoscopy Indications:           Dyspepsia Providers:             Wyline Mood MD, MD Referring MD:          Wyline Mood MD, MD (Referring MD), No Local Md, MD                         (Referring MD) Medicines:             Monitored Anesthesia Care Complications:         No immediate complications. Procedure:             Pre-Anesthesia Assessment:                        - Prior to the procedure, a History and Physical was                         performed, and patient medications, allergies and                         sensitivities were reviewed. The patient's tolerance                         of previous anesthesia was reviewed.                        - The risks and benefits of the procedure and the                         sedation options and risks were discussed with the                         patient. All questions were answered and informed                         consent was obtained.                        - ASA Grade Assessment: II - A patient with mild                         systemic disease.                        After obtaining informed consent, the endoscope was                         passed under direct vision. Throughout the procedure,                         the patient's blood pressure, pulse, and oxygen                         saturations were  monitored continuously. The Endoscope                         was introduced through the mouth, and advanced to the                         third part of duodenum. The upper GI endoscopy was                         accomplished with ease. The patient tolerated the                          procedure well. Findings:      The esophagus was normal.      The examined duodenum was normal.      Three non-bleeding superficial gastric ulcers with no stigmata of       bleeding were found on the greater curvature of the gastric antrum. The       largest lesion was 7 mm in largest dimension. Biopsies were taken with a       cold forceps for histology.      The cardia and gastric fundus were normal on retroflexion. Impression:            - Normal esophagus.                        - Normal examined duodenum.                        - Non-bleeding gastric ulcers with no stigmata of                         bleeding. Biopsied. Recommendation:        - Await pathology results.                        - Prilosec 40 mg BID for 3 months                        - Repeat upper endoscopy in 8 weeks to evaluate the                         response to therapy. Procedure Code(s):     --- Professional ---                        [phone], Esophagogastroduodenoscopy, flexible,                         transoral; with biopsy, single or multiple Diagnosis Code(s):     --- Professional ---                        K25.9, Gastric ulcer, unspecified as acute or chronic,                         without hemorrhage or perforation                        R10.13, Epigastric pain CPT copyright 2022 American Medical Association. All rights reserved. The codes documented in this report are  preliminary and upon coder review may  be revised to meet current compliance requirements. Wyline Mood, MD Wyline Mood MD, MD 11/13/2022 9:06:20 AM This report has been signed electronically. Number of Addenda: 0 Note Initiated On: 11/13/2022 8:54 AM Estimated Blood Loss:  Estimated blood loss: none.      [facility]

## 2022-11-13 NOTE — Anesthesia Postprocedure Evaluation (Signed)
Anesthesia Post Note  Patient: Suzanne Stout  Procedure(s) Performed: ESOPHAGOGASTRODUODENOSCOPY (EGD) WITH PROPOFOL COLONOSCOPY WITH PROPOFOL  Patient location during evaluation: Endoscopy Anesthesia Type: General Level of consciousness: awake and alert Pain management: pain level controlled Vital Signs Assessment: post-procedure vital signs reviewed and stable Respiratory status: spontaneous breathing, nonlabored ventilation, respiratory function stable and patient connected to nasal cannula oxygen Cardiovascular status: blood pressure returned to baseline and stable Postop Assessment: no apparent nausea or vomiting Anesthetic complications: no   No notable events documented.   Last Vitals:  Vitals:   11/13/22 0932 11/13/22 0943  BP: (!) 95/53 (!) 91/58  Pulse: 69 69  Resp: 12 18  Temp:    SpO2: 100% 100%    Last Pain:  Vitals:   11/13/22 0943  TempSrc:   PainSc: 0-No pain                 Cleda Mccreedy Meshilem Machuca

## 2022-11-13 NOTE — Op Note (Signed)
[facility] Patient Name: Suzanne Stout Procedure Date: 11/13/2022 8:53 AM MRN: [id number] Account #: [id number] Date of Birth: 1975/09/02 Admit Type: Outpatient Age: 47 Room: [facility] ENDO ROOM 2 Gender: Female Note Status: Finalized Instrument Name: Prentice Docker [id number] Procedure:             Colonoscopy Indications:           Rectal bleeding Providers:             Wyline Mood MD, MD Referring MD:          Wyline Mood MD, MD (Referring MD), No Local Md, MD                         (Referring MD) Medicines:             Monitored Anesthesia Care Complications:         No immediate complications. Procedure:             Pre-Anesthesia Assessment:                        - Prior to the procedure, a History and Physical was                         performed, and patient medications, allergies and                         sensitivities were reviewed. The patient's tolerance                         of previous anesthesia was reviewed.                        - The risks and benefits of the procedure and the                         sedation options and risks were discussed with the                         patient. All questions were answered and informed                         consent was obtained.                        - ASA Grade Assessment: II - A patient with mild                         systemic disease.                        After obtaining informed consent, the colonoscope was                         passed under direct vision. Throughout the procedure,                         the patient's blood pressure, pulse, and oxygen                         saturations were monitored continuously.  The                         Colonoscope was introduced through the anus and                         advanced to the the cecum, identified by the                         appendiceal orifice. The colonoscopy was performed                         with ease. The patient  tolerated the procedure well.                         The quality of the bowel preparation was excellent.                         The ileocecal valve, appendiceal orifice, and rectum                         were photographed. Findings:      The perianal and digital rectal examinations were normal.      Non-bleeding internal hemorrhoids were found during retroflexion. The       hemorrhoids were medium-sized and Grade I (internal hemorrhoids that do       not prolapse).      The exam was otherwise without abnormality on direct and retroflexion       views. Impression:            - Non-bleeding internal hemorrhoids.                        - The examination was otherwise normal on direct and                         retroflexion views.                        - No specimens collected. Recommendation:        - Discharge patient to home (with escort).                        - Resume previous diet.                        - Continue present medications.                        - Repeat colonoscopy in 10 years for screening                         purposes. Procedure Code(s):     --- Professional ---                        [phone], Colonoscopy, flexible; diagnostic, including                         collection of specimen(s) by brushing or washing, when  performed (separate procedure) Diagnosis Code(s):     --- Professional ---                        K64.0, First degree hemorrhoids                        K62.5, Hemorrhage of anus and rectum CPT copyright 2022 American Medical Association. All rights reserved. The codes documented in this report are preliminary and upon coder review may  be revised to meet current compliance requirements. Wyline Mood, MD Wyline Mood MD, MD 11/13/2022 9:19:59 AM This report has been signed electronically. Number of Addenda: 0 Note Initiated On: 11/13/2022 8:53 AM Scope Withdrawal Time: 0 hours 7 minutes 31 seconds  Total Procedure Duration:  0 hours 10 minutes 19 seconds  Estimated Blood Loss:  Estimated blood loss: none.      [facility]

## 2022-11-14 ENCOUNTER — Encounter: Payer: Self-pay | Admitting: Physician Assistant

## 2022-11-14 ENCOUNTER — Encounter: Payer: Self-pay | Admitting: Gastroenterology

## 2022-11-15 LAB — SURGICAL PATHOLOGY

## 2022-11-15 MED ORDER — OMEPRAZOLE 20 MG PO CPDR: 20.0000 mg | DELAYED_RELEASE_CAPSULE | Freq: Two times a day (BID) | ORAL | 2 refills | Status: AC

## 2022-11-21 ENCOUNTER — Encounter: Payer: Self-pay | Admitting: Gastroenterology
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
## 2023-05-01 ENCOUNTER — Ambulatory Visit
Admission: RE | Admit: 2023-05-01 | Discharge: 2023-05-01 | Disposition: A | Discharge status: Home / Self Care | Source: Ambulatory Visit | Attending: Emergency Medicine | Admitting: Emergency Medicine

## 2023-05-01 VITALS — BP 108/75 | HR 94 | Temp 98.5°F | Resp 16

## 2023-05-01 DIAGNOSIS — J069 Acute upper respiratory infection, unspecified: Secondary | ICD-10-CM | POA: Insufficient documentation

## 2023-05-01 LAB — RESP PANEL BY RT-PCR (FLU A&B, COVID) ARPGX2
Influenza A by PCR: NEGATIVE
Influenza B by PCR: NEGATIVE
SARS Coronavirus 2 by RT PCR: NEGATIVE

## 2023-05-01 LAB — GROUP A STREP BY PCR: Group A Strep by PCR: NOT DETECTED

## 2023-05-01 MED ORDER — IPRATROPIUM BROMIDE 0.06 % NA SOLN: 2.0000 | Freq: Four times a day (QID) | NASAL | 12 refills | Status: AC

## 2023-05-01 MED ORDER — ONDANSETRON 4 MG PO TBDP
4.0000 mg | ORAL_TABLET | Freq: Once | ORAL | Status: AC
  Administered 2023-05-01: 4 mg via ORAL

## 2023-05-01 MED ORDER — BENZONATATE 100 MG PO CAPS: 200.0000 mg | ORAL_CAPSULE | Freq: Three times a day (TID) | ORAL | 0 refills | Status: AC

## 2023-05-01 MED ORDER — DEXAMETHASONE SODIUM PHOSPHATE 10 MG/ML IJ SOLN
10.0000 mg | Freq: Once | INTRAMUSCULAR | Status: AC
  Administered 2023-05-01: 10 mg via INTRAMUSCULAR

## 2023-05-01 MED ORDER — ONDANSETRON 4 MG PO TBDP: 4.0000 mg | ORAL_TABLET | Freq: Three times a day (TID) | ORAL | 0 refills | Status: AC | PRN

## 2023-05-01 MED ORDER — PROMETHAZINE-DM 6.25-15 MG/5ML PO SYRP: 5.0000 mL | ORAL_SOLUTION | Freq: Four times a day (QID) | ORAL | 0 refills | Status: AC | PRN

## 2023-05-01 NOTE — ED Provider Notes (Signed)
 [facility]    CSN: [id number] Arrival date & time: 05/01/23  1027      History   Chief Complaint Chief Complaint  Patient presents with   Sore Throat    Sore throat; ears hurt; stuffy nose; sinus pressure. - Entered by patient   Cough    HPI Suzanne Stout is a 48 y.o. female.   HPI  58 old female with past medical history significant for psoriasis, hypertension, anxiety, RLS, hypertension, and bilateral breast cancer awaiting the start of chemo and radiation presents for evaluation of respiratory symptoms that started yesterday.  These include sore throat, nasal congestion and sinus pressure, nonproductive cough, shortness breath, wheezing, and nausea.  She also has a loss of her voice.  She denies any nasal discharge, vomiting, or diarrhea.  Past Medical History:  Diagnosis Date   Anxiety    Complication of anesthesia 2003   had a hard time waking    Hypertension    Psoriasis    RLS (restless legs syndrome)     Patient Active Problem List   Diagnosis Date Noted   Rectal bleeding 11/13/2022   Abdominal pain, epigastric 11/13/2022   S/P laparoscopic hysterectomy 09/01/2020   Abnormal uterine bleeding (AUB)    Intramural leiomyoma of uterus    Unwanted fertility    Abdominal pain 01/17/2016   Gastric distention 01/17/2016   HTN (hypertension) 01/17/2016   RLS (restless legs syndrome) 01/17/2016    Past Surgical History:  Procedure Laterality Date   BREAST BIOPSY Right 10/17/2022   affirm bx x marker path -pending   BREAST BIOPSY Left 10/17/2022   stereo bx, distortion, RIBBON clip-path pending   BREAST BIOPSY Right 10/17/2022   MM RT BREAST BX W LOC DEV EA AD LESION IMG BX SPEC STEREO GUIDE 10/17/2022 [facility]   BREAST BIOPSY Left 10/17/2022   MM LT BREAST BX W LOC DEV 1ST LESION IMAGE BX SPEC STEREO GUIDE 10/17/2022 [facility]   BREAST LUMPECTOMY Right 2002   COLONOSCOPY WITH PROPOFOL N/A 11/13/2022   Procedure: COLONOSCOPY WITH  PROPOFOL;  Surgeon: Luke Salaam, MD;  Location: [facility] ENDOSCOPY;  Service: Gastroenterology;  Laterality: N/A;   ESOPHAGOGASTRODUODENOSCOPY (EGD) WITH PROPOFOL N/A 01/19/2016   Procedure: ESOPHAGOGASTRODUODENOSCOPY (EGD) WITH PROPOFOL;  Surgeon: Luke Salaam, MD;  Location: [facility] ENDOSCOPY;  Service: Endoscopy;  Laterality: N/A;   ESOPHAGOGASTRODUODENOSCOPY (EGD) WITH PROPOFOL N/A 11/13/2022   Procedure: ESOPHAGOGASTRODUODENOSCOPY (EGD) WITH PROPOFOL;  Surgeon: Luke Salaam, MD;  Location: [facility] ENDOSCOPY;  Service: Gastroenterology;  Laterality: N/A;   LAPAROSCOPIC BILATERAL SALPINGECTOMY Bilateral 04/12/2020   Procedure: LAPAROSCOPIC BILATERAL SALPINGECTOMY; IUD INSERTION;  Surgeon: Darl Edu, MD;  Location: [facility];  Service: Gynecology;  Laterality: Bilateral;   LAPAROSCOPIC HYSTERECTOMY N/A 09/01/2020   Procedure: HYSTERECTOMY TOTAL LAPAROSCOPIC REMOVAL OF IUD AND CYSTOSCOPY;  Surgeon: Darl Edu, MD;  Location: [facility];  Service: Gynecology;  Laterality: N/A;   VAGINAL HYSTERECTOMY      OB History     Gravida  1   Para  1   Term  1   Preterm      AB      Living  1      SAB      IAB      Ectopic      Multiple      Live Births               Home Medications    Prior to Admission medications   Medication Sig Start Date End Date Taking?  Authorizing Provider  benzonatate (TESSALON) 100 MG capsule Take 2 capsules (200 mg total) by mouth every 8 (eight) hours. 05/01/23  Yes Kent Pear, NP  ipratropium (ATROVENT) 0.06 % nasal spray Place 2 sprays into both nostrils 4 (four) times daily. 05/01/23  Yes Kent Pear, NP  lisinopril-hydrochlorothiazide (ZESTORETIC) 10-12.5 MG tablet Take 1 tablet by mouth every evening.   Yes [provider]  promethazine-dextromethorphan (PROMETHAZINE-DM) 6.25-15 MG/5ML syrup Take 5 mLs by mouth 4 (four) times daily as needed. 05/01/23  Yes Kent Pear, NP  rOPINIRole (REQUIP) 0.25 MG tablet Take 0.25 mg by mouth at  bedtime.   Yes [provider]  omeprazole (PRILOSEC) 20 MG capsule Take 1 capsule (20 mg total) by mouth 2 (two) times daily before a meal. 11/15/22   Luke Salaam, MD  ondansetron (ZOFRAN-ODT) 4 MG disintegrating tablet Take 1 tablet (4 mg total) by mouth every 8 (eight) hours as needed for nausea or vomiting. 05/01/23   Kent Pear, NP  promethazine (PHENERGAN) 25 MG tablet Take 1 tablet (25 mg total) by mouth every 6 (six) hours as needed for nausea or vomiting. 09/13/22   Ward, Clover Dao, DO  scopolamine (TRANSDERM-SCOP) 1 MG/3DAYS 1 patch every 3 (three) days. 06/30/22   [provider]  sucralfate (CARAFATE) 1 g tablet Take 1 tablet (1 g total) by mouth 4 (four) times daily for 14 days. 09/12/22 11/13/22  Viviano Ground, MD  tamoxifen (NOLVADEX) 20 MG tablet Take 20 mg by mouth daily.   Yes [provider]    Family History Family History  Problem Relation Age of Onset   Hypertension Mother    Alcoholism Father    Breast cancer Maternal Aunt 6 - 50   Gallbladder disease Other     Social History Social History   Tobacco Use   Smoking status: Never   Smokeless tobacco: Never  Vaping Use   Vaping status: Never Used  Substance Use Topics   Alcohol use: No   Drug use: Never     Allergies   Latex and Oxycodone   Review of Systems Review of Systems  Constitutional:  Negative for fever.  HENT:  Positive for congestion, ear pain, sinus pressure, sore throat and voice change. Negative for rhinorrhea.   Respiratory:  Positive for cough. Negative for shortness of breath and wheezing.   Gastrointestinal:  Positive for nausea. Negative for diarrhea and vomiting.     Physical Exam Triage Vital Signs ED Triage Vitals  Encounter Vitals Group     BP      Systolic BP Percentile      Diastolic BP Percentile      Pulse      Resp      Temp      Temp src      SpO2      Weight      Height      Head Circumference      Peak Flow      Pain Score       Pain Loc      Pain Education      Exclude from Growth Chart    No data found.  Updated Vital Signs BP 108/75 (BP Location: Right Arm)   Pulse 94   Temp 98.5 F (36.9 C) (Oral)   Resp 16   SpO2 95%   Visual Acuity Right Eye Distance:   Left Eye Distance:   Bilateral Distance:    Right Eye Near:   Left Eye Near:  Bilateral Near:     Physical Exam Vitals and nursing note reviewed.  Constitutional:      Appearance: Normal appearance. She is not ill-appearing.  HENT:     Head: Normocephalic and atraumatic.     Right Ear: Tympanic membrane, ear canal and external ear normal. There is no impacted cerumen.     Left Ear: Tympanic membrane, ear canal and external ear normal. There is no impacted cerumen.     Nose: Congestion and rhinorrhea present.     Comments: This mucosa is edematous and erythematous with scant clear discharge in both nares.    Mouth/Throat:     Mouth: Mucous membranes are moist.     Pharynx: Oropharynx is clear. Posterior oropharyngeal erythema present. No oropharyngeal exudate.     Comments: Soft palate and tonsillar pillars are erythematous and 1+ edematous but I do not appreciate any exudate. Neck:     Comments: Patient has tenderness to the anterior cervical region bilaterally without appreciable lymphadenopathy. Cardiovascular:     Rate and Rhythm: Normal rate and regular rhythm.     Pulses: Normal pulses.     Heart sounds: Normal heart sounds. No murmur heard.    No friction rub. No gallop.  Pulmonary:     Effort: Pulmonary effort is normal.     Breath sounds: Normal breath sounds. No wheezing, rhonchi or rales.  Musculoskeletal:     Cervical back: Normal range of motion and neck supple. Tenderness present.  Lymphadenopathy:     Cervical: No cervical adenopathy.  Skin:    General: Skin is warm and dry.     Capillary Refill: Capillary refill takes less than 2 seconds.     Findings: No rash.  Neurological:     General: No focal deficit present.      Mental Status: She is alert and oriented to person, place, and time.      UC Treatments / Results  Labs (all labs ordered are listed, but only abnormal results are displayed) Labs Reviewed  GROUP A STREP BY PCR  RESP PANEL BY RT-PCR (FLU A&B, COVID) [id number]    EKG   Radiology No results found.  Procedures Procedures (including critical care time)  Medications Ordered in UC Medications  dexamethasone (DECADRON) injection 10 mg (has no administration in time range)  ondansetron (ZOFRAN-ODT) disintegrating tablet 4 mg (4 mg Oral Given 05/01/23 1045)    Initial Impression / Assessment and Plan / UC Course  I have reviewed the triage vital signs and the nursing notes.  Pertinent labs & imaging results that were available during my care of the patient were reviewed by me and considered in my medical decision making (see chart for details).   Patient is a pleasant, nontoxic-appearing 49 year old female presenting for evaluation of respiratory symptoms as outlined in HPI above.  In the exam room the patient has a very muffled voice secondary to her sore throat pain.  Her tonsillar pillars and soft palate are erythematous but I do not appreciate any exudate.  I will order a strep PCR.  She is also experiencing nasal congestion but she denies any nasal discharge.  Her nasal mucosa is inflamed and I do not appreciate any significant discharge on exam.  Anterior cervical region is tender to palpation without the presence of lymphadenopathy.  Cardiopulmonary exam reveals clear lung sounds in all fields.  Differential diagnose include COVID, influenza, strep pharyngitis, viral respiratory illness.  I will order a COVID and flu PCR as well as a strep  PCR.  4 mg of Zofran to be given for patient's nausea.  Strep PCR is negative.  Respiratory panel is negative for COVID or influenza.  I will discharge patient home with a diagnosis of viral URI with cough with a prescription for Atrovent  nasal spray to help with nasal congestion.  Tessalon Perles and Promethazine DM cough syrup for cough and congestion.  I did offer the patient an injection of Decadron to help with her sore throat pain.  I will also send her home with a prescription for Zofran to help her with her nausea.   Final Clinical Impressions(s) / UC Diagnoses   Final diagnoses:  Viral URI with cough     Discharge Instructions      You have tested negative for COVID, influenza, and strep today.  I do believe that your symptoms are being caused by respiratory virus.  Use over-the-counter Tylenol and/or ibuprofen according the package instructions as needed for any pain.  Use the Zofran 4 mg every 6 hours as needed for nausea or vomiting.  Use the Atrovent nasal spray, 2 squirts in each nostril every 6 hours, as needed for runny nose and postnasal drip.  Use the Tessalon Perles every 8 hours during the day.  Take them with a small sip of water.  They may give you some numbness to the base of your tongue or a metallic taste in your mouth, this is normal.  Use the Promethazine DM cough syrup at bedtime for cough and congestion.  It will make you drowsy so do not take it during the day.  Return for reevaluation or see your primary care provider for any new or worsening symptoms.      ED Prescriptions     Medication Sig Dispense Auth. Provider   ondansetron (ZOFRAN-ODT) 4 MG disintegrating tablet Take 1 tablet (4 mg total) by mouth every 8 (eight) hours as needed for nausea or vomiting. 9 tablet Kent Pear, NP   benzonatate (TESSALON) 100 MG capsule Take 2 capsules (200 mg total) by mouth every 8 (eight) hours. 21 capsule Kent Pear, NP   ipratropium (ATROVENT) 0.06 % nasal spray Place 2 sprays into both nostrils 4 (four) times daily. 15 mL Kent Pear, NP   promethazine-dextromethorphan (PROMETHAZINE-DM) 6.25-15 MG/5ML syrup Take 5 mLs by mouth 4 (four) times daily as needed. 118 mL Kent Pear, NP       PDMP not reviewed this encounter.   Kent Pear, NP 05/01/23 1140

## 2023-05-01 NOTE — ED Triage Notes (Signed)
 Sore throat, sinus pressure, cough, congestion, nausea x 1 day. Taking dyquil.

## 2023-05-01 NOTE — Discharge Instructions (Addendum)
 You have tested negative for COVID, influenza, and strep today.  I do believe that your symptoms are being caused by respiratory virus.  Use over-the-counter Tylenol and/or ibuprofen according the package instructions as needed for any pain.  Use the Zofran 4 mg every 6 hours as needed for nausea or vomiting.  Use the Atrovent nasal spray, 2 squirts in each nostril every 6 hours, as needed for runny nose and postnasal drip.  Use the Tessalon Perles every 8 hours during the day.  Take them with a small sip of water.  They may give you some numbness to the base of your tongue or a metallic taste in your mouth, this is normal.  Use the Promethazine DM cough syrup at bedtime for cough and congestion.  It will make you drowsy so do not take it during the day.  Return for reevaluation or see your primary care provider for any new or worsening symptoms.

## 2023-05-11 IMAGING — CR DG ABDOMEN 1V
1 series · 2 of 2 positions shown · non-contrast
Comparison: 01/18/2016 abdominal radiograph

CLINICAL DATA: Right flank pain, history of nephrolithiasis

EXAM:
ABDOMEN - 1 VIEW

[Series 1: dg abd 1 view · 0.14mm/px · 2 of 2 slices shown]
[im 1/2]
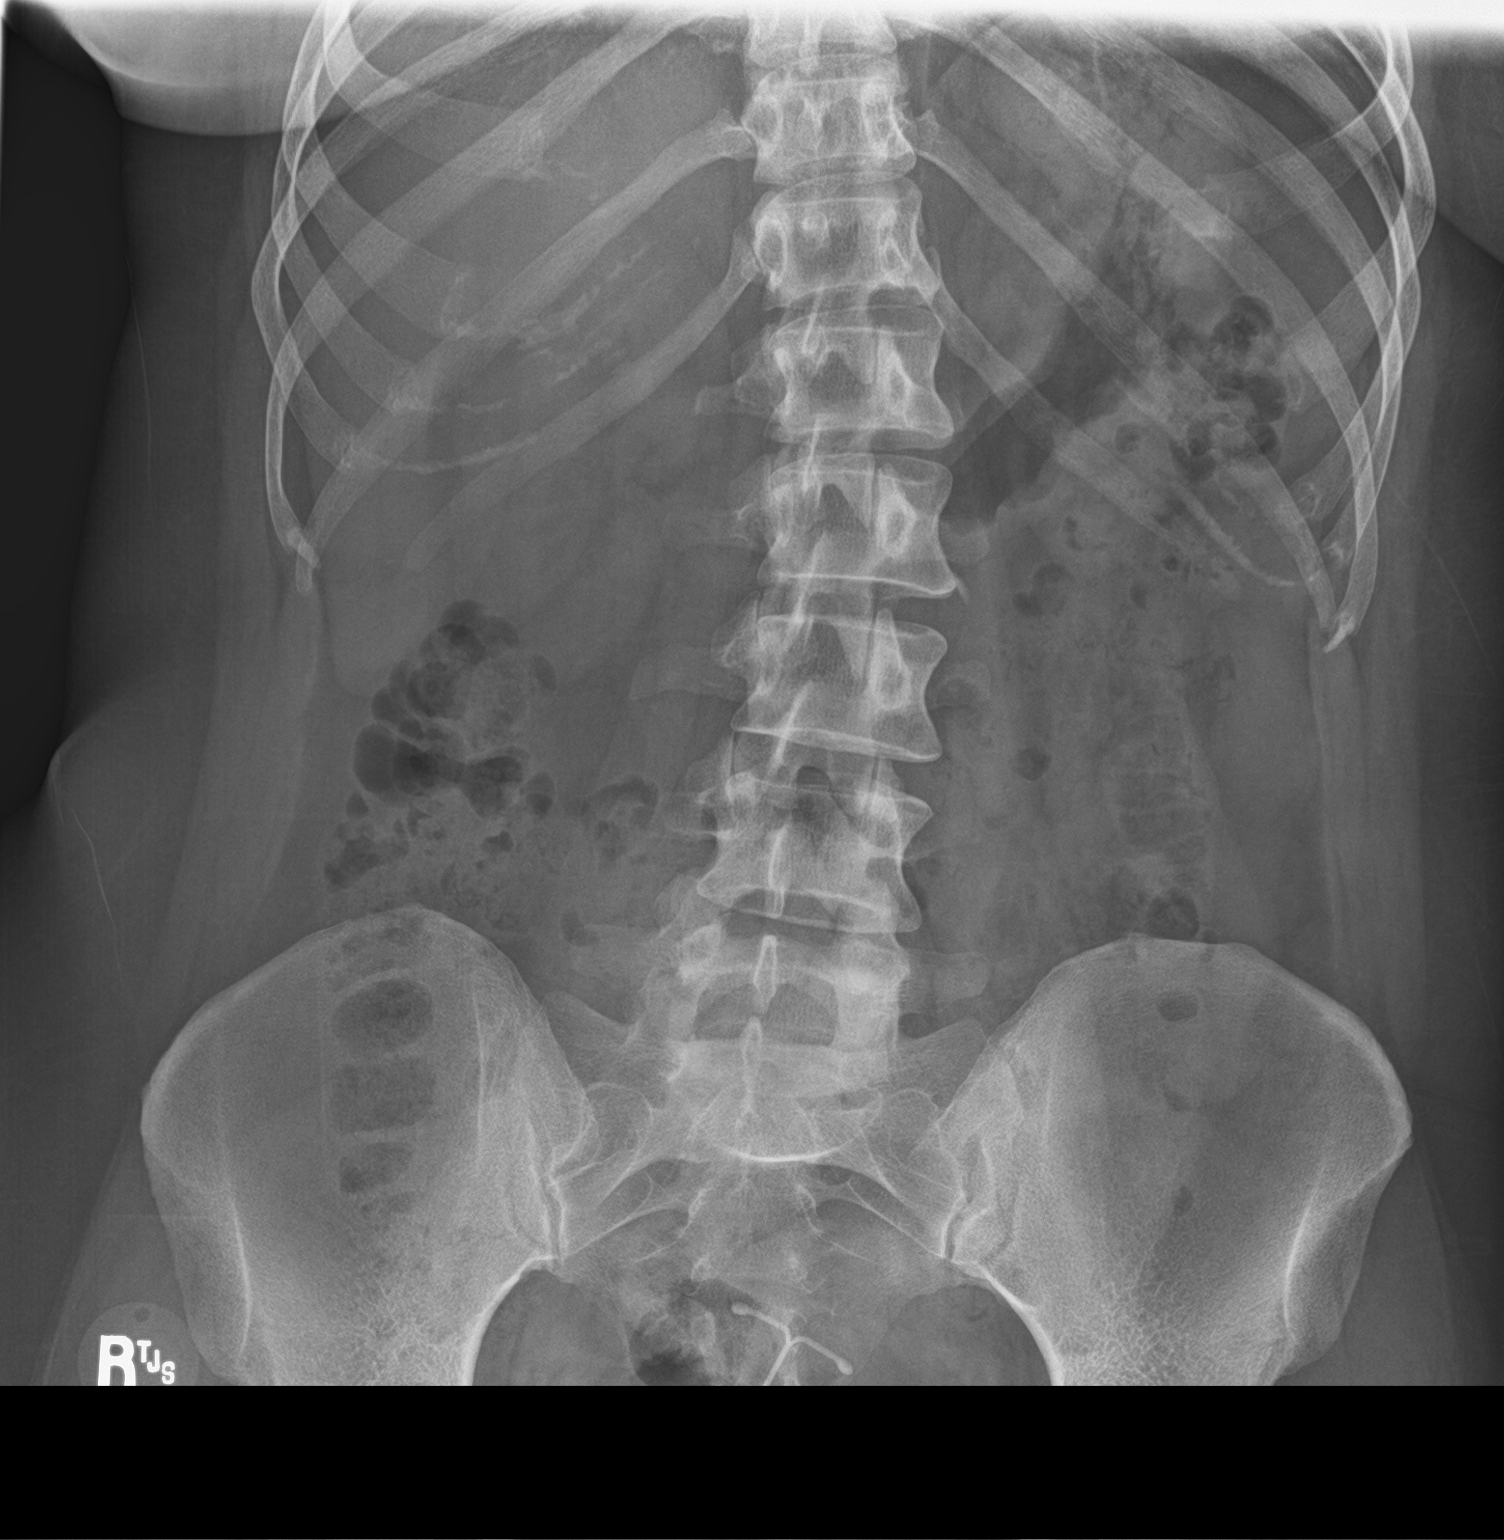
[im 2/2]
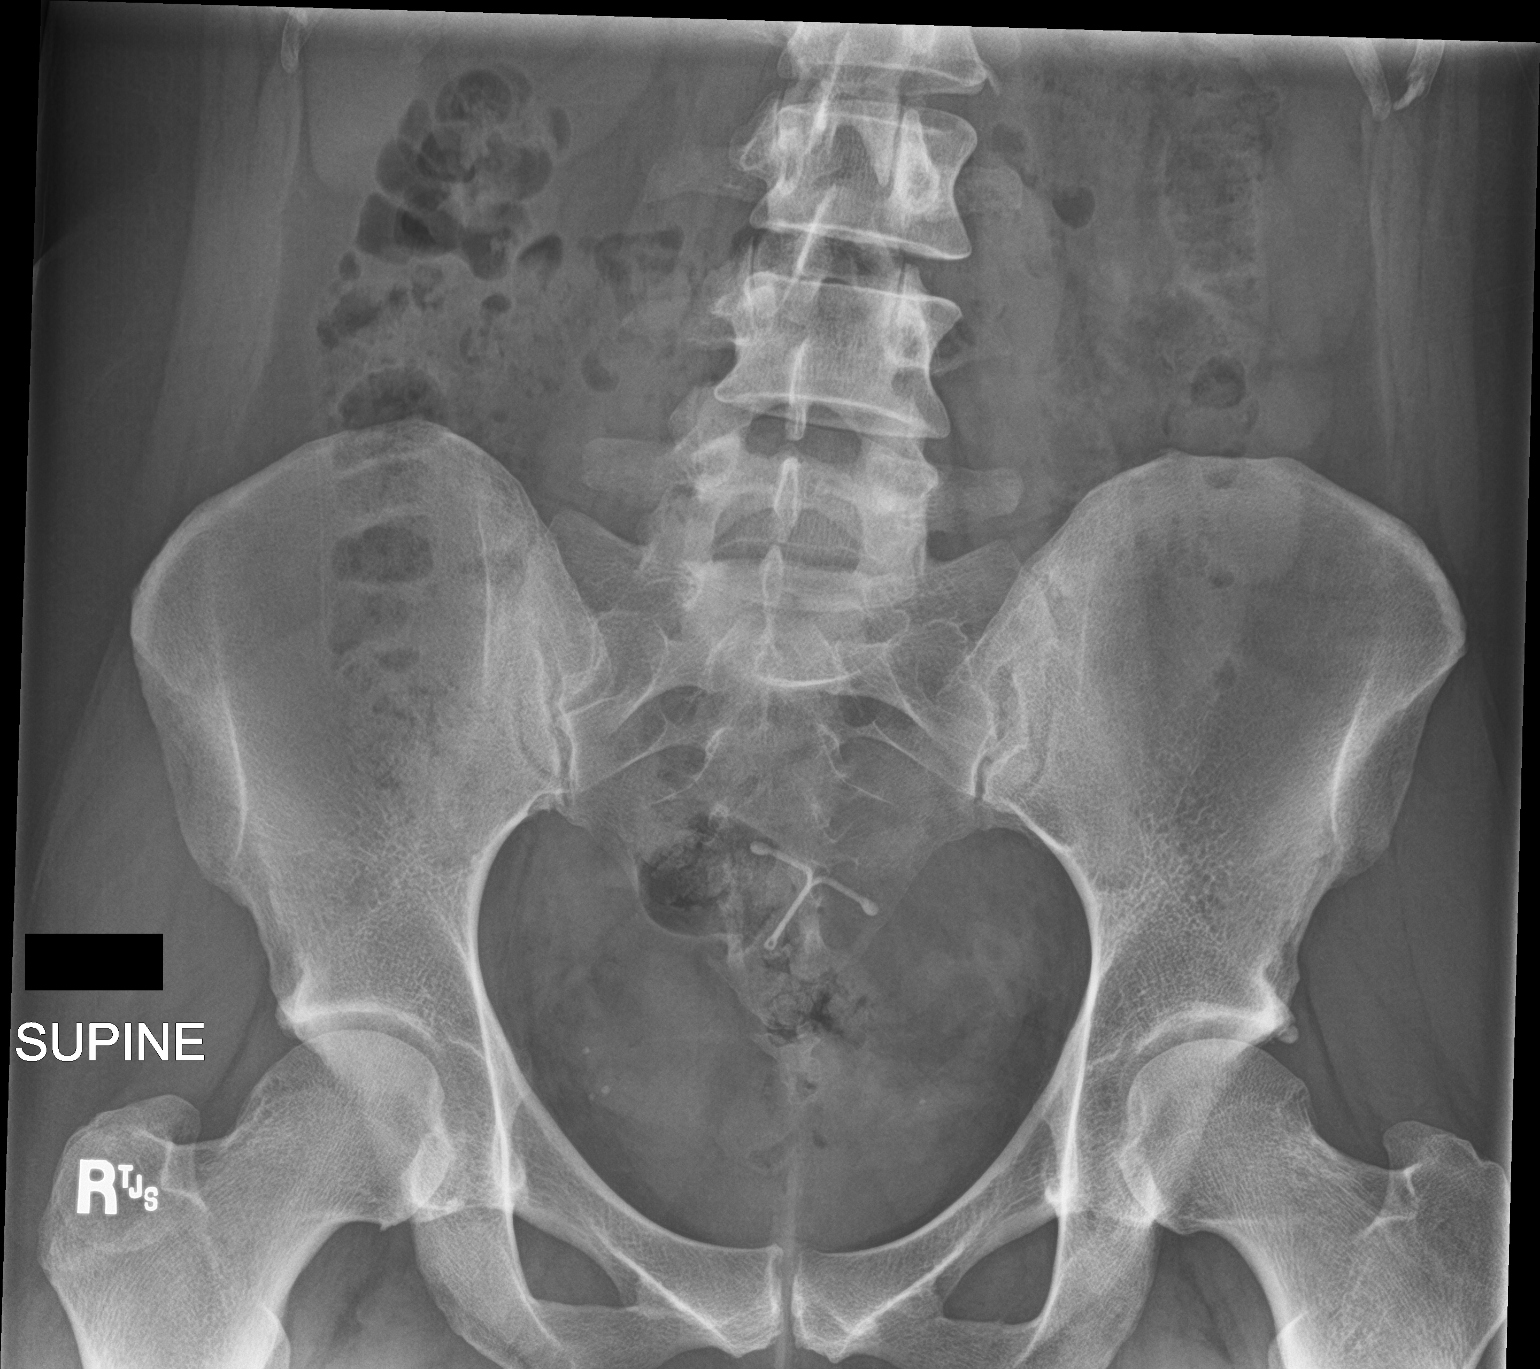

[2 of 2 positions shown; findings below may reference images not displayed]

FINDINGS: No dilated small bowel loops. Mild colonic stool. No evidence of
pneumatosis or pneumoperitoneum. IUD overlies the midline lower
sacrum. No radiopaque stones overlie kidneys or expected locations
of the ureters or bladder. Tiny calcified venous phleboliths in the
deep pelvis are unchanged from prior CT.
IMPRESSION: No radiopaque urolithiasis. Nonobstructive bowel gas pattern.

## 2023-05-13 ENCOUNTER — Other Ambulatory Visit: Payer: Self-pay | Admitting: Gastroenterology

## 2023-05-28 ENCOUNTER — Ambulatory Visit: Payer: BC Managed Care – PPO | Admitting: Dermatology
